# Patient Record
Sex: Male | Born: 1945 | Race: White | Hispanic: No | Marital: Married | Smoking: Former smoker
Health system: Southern US, Community
[De-identification: ages and names within clinical notes are randomized; demographics above are authoritative.]

## PROBLEM LIST (undated history)

## (undated) DIAGNOSIS — J449 Chronic obstructive pulmonary disease, unspecified: Secondary | ICD-10-CM

## (undated) DIAGNOSIS — K219 Gastro-esophageal reflux disease without esophagitis: Secondary | ICD-10-CM

## (undated) DIAGNOSIS — R001 Bradycardia, unspecified: Secondary | ICD-10-CM

## (undated) HISTORY — DX: Chronic obstructive pulmonary disease, unspecified: J44.9

## (undated) HISTORY — PX: HERNIA REPAIR: SHX51

## (undated) HISTORY — DX: Gastro-esophageal reflux disease without esophagitis: K21.9

## (undated) HISTORY — PX: AMPUTATION OF REPLICATED FINGERS: SHX1135

## (undated) HISTORY — DX: Bradycardia, unspecified: R00.1

---

## 1999-09-24 ENCOUNTER — Ambulatory Visit (HOSPITAL_BASED_OUTPATIENT_CLINIC_OR_DEPARTMENT_OTHER): Admission: RE | Admit: 1999-09-24 | Discharge: 1999-09-24 | Payer: Self-pay | Admitting: Orthopedic Surgery

## 1999-09-24 ENCOUNTER — Encounter (INDEPENDENT_AMBULATORY_CARE_PROVIDER_SITE_OTHER): Payer: Self-pay | Admitting: Specialist

## 2000-02-09 ENCOUNTER — Ambulatory Visit (HOSPITAL_COMMUNITY): Admission: RE | Admit: 2000-02-09 | Discharge: 2000-02-09 | Payer: Self-pay | Admitting: Cardiovascular Disease

## 2000-02-09 ENCOUNTER — Encounter: Payer: Self-pay | Admitting: Cardiovascular Disease

## 2004-10-17 ENCOUNTER — Encounter (INDEPENDENT_AMBULATORY_CARE_PROVIDER_SITE_OTHER): Payer: Self-pay | Admitting: *Deleted

## 2004-10-17 ENCOUNTER — Ambulatory Visit (HOSPITAL_COMMUNITY): Admission: RE | Admit: 2004-10-17 | Discharge: 2004-10-17 | Payer: Self-pay | Admitting: Gastroenterology

## 2006-03-09 ENCOUNTER — Encounter: Payer: Self-pay | Admitting: Pulmonary Disease

## 2006-03-09 ENCOUNTER — Encounter: Admission: RE | Admit: 2006-03-09 | Discharge: 2006-03-09 | Payer: Self-pay | Admitting: Family Medicine

## 2006-06-07 ENCOUNTER — Inpatient Hospital Stay (HOSPITAL_COMMUNITY): Admission: EM | Admit: 2006-06-07 | Discharge: 2006-06-09 | Payer: Self-pay | Admitting: Emergency Medicine

## 2006-06-07 ENCOUNTER — Ambulatory Visit: Payer: Self-pay | Admitting: Internal Medicine

## 2006-06-07 ENCOUNTER — Ambulatory Visit: Payer: Self-pay | Admitting: Cardiology

## 2006-08-09 ENCOUNTER — Encounter: Payer: Self-pay | Admitting: Pulmonary Disease

## 2006-08-09 ENCOUNTER — Encounter: Admission: RE | Admit: 2006-08-09 | Discharge: 2006-08-09 | Payer: Self-pay | Admitting: Family Medicine

## 2006-08-31 ENCOUNTER — Ambulatory Visit: Payer: Self-pay | Admitting: Pulmonary Disease

## 2007-12-21 ENCOUNTER — Emergency Department (HOSPITAL_COMMUNITY): Admission: EM | Admit: 2007-12-21 | Discharge: 2007-12-22 | Payer: Self-pay | Admitting: Emergency Medicine

## 2007-12-23 ENCOUNTER — Ambulatory Visit: Payer: Self-pay | Admitting: Cardiovascular Disease

## 2007-12-23 ENCOUNTER — Ambulatory Visit: Payer: Self-pay | Admitting: Pulmonary Disease

## 2007-12-23 DIAGNOSIS — J449 Chronic obstructive pulmonary disease, unspecified: Secondary | ICD-10-CM

## 2007-12-23 DIAGNOSIS — J441 Chronic obstructive pulmonary disease with (acute) exacerbation: Secondary | ICD-10-CM

## 2007-12-23 DIAGNOSIS — R042 Hemoptysis: Secondary | ICD-10-CM | POA: Insufficient documentation

## 2007-12-23 LAB — CONVERTED CEMR LAB
BUN: 17 mg/dL (ref 6–23)
Chloride: 106 meq/L (ref 96–112)
Glucose, Bld: 111 mg/dL — ABNORMAL HIGH (ref 70–99)
Potassium: 4.6 meq/L (ref 3.5–5.1)
Sodium: 135 meq/L (ref 135–145)

## 2008-03-22 ENCOUNTER — Ambulatory Visit: Payer: Self-pay | Admitting: Pulmonary Disease

## 2008-09-21 ENCOUNTER — Ambulatory Visit: Payer: Self-pay | Admitting: Pulmonary Disease

## 2008-10-01 ENCOUNTER — Ambulatory Visit: Payer: Self-pay | Admitting: Cardiology

## 2008-10-24 ENCOUNTER — Encounter: Admission: RE | Admit: 2008-10-24 | Discharge: 2008-10-24 | Payer: Self-pay | Admitting: Family Medicine

## 2008-10-29 ENCOUNTER — Telehealth (INDEPENDENT_AMBULATORY_CARE_PROVIDER_SITE_OTHER): Payer: Self-pay | Admitting: *Deleted

## 2008-10-30 ENCOUNTER — Ambulatory Visit: Payer: Self-pay | Admitting: Pulmonary Disease

## 2008-12-04 DIAGNOSIS — K219 Gastro-esophageal reflux disease without esophagitis: Secondary | ICD-10-CM

## 2008-12-04 DIAGNOSIS — R42 Dizziness and giddiness: Secondary | ICD-10-CM | POA: Insufficient documentation

## 2008-12-04 DIAGNOSIS — I498 Other specified cardiac arrhythmias: Secondary | ICD-10-CM | POA: Insufficient documentation

## 2009-01-09 ENCOUNTER — Encounter (INDEPENDENT_AMBULATORY_CARE_PROVIDER_SITE_OTHER): Payer: Self-pay | Admitting: *Deleted

## 2009-03-20 ENCOUNTER — Ambulatory Visit: Payer: Self-pay | Admitting: Pulmonary Disease

## 2009-04-25 ENCOUNTER — Telehealth: Payer: Self-pay | Admitting: Pulmonary Disease

## 2009-09-16 ENCOUNTER — Ambulatory Visit: Payer: Self-pay | Admitting: Pulmonary Disease

## 2009-10-21 ENCOUNTER — Telehealth (INDEPENDENT_AMBULATORY_CARE_PROVIDER_SITE_OTHER): Payer: Self-pay | Admitting: *Deleted

## 2010-03-17 ENCOUNTER — Ambulatory Visit: Payer: Self-pay | Admitting: Pulmonary Disease

## 2010-09-04 NOTE — Assessment & Plan Note (Signed)
Summary: rov for emphysema.   Primary Provider/Referring Provider:  [facility]  CC:  Pt is here for a 6 month f/u appt.  Pt states breathing is better since being on Symbicort.  Pt states he will occ cough up white sputum. Pt states he hasn't needed to use neb meds since he was last seen by Dr. Shelle Iron.  Marland Kitchen  History of Present Illness: the pt comes in today for f/u of his known emphysema.  He feels that he has been doing much better since being on symbicort, and has had no recent acute exacerbation.  He feels his exertional tolerance is at his usual baseline.  He denies any chest congestion, cough, or purulence.  Current Medications (verified): 1)  Albuterol Sulfate (2.5 Mg/29ml) 0.083%  Nebu (Albuterol Sulfate) .... As Directed 2)  Multivitamins   Tabs (Multiple Vitamin) .Marland Kitchen.. 1 By Mouth Daily 3)  Vitamin B-12 1000 Mcg  Tabs (Cyanocobalamin) .Marland Kitchen.. 1 By Mouth Daily 4)  Symbicort 160-4.5 Mcg/act Aero (Budesonide-Formoterol Fumarate) .... Inhale 2 Puffs Two Times A Day. Rinse, Gargle, Spit Well After Use. 5)  Proair Hfa 108 (90 Base) Mcg/act Aers (Albuterol Sulfate) .... 2 Puffs Every 4 Hours As Needed For Emergency 6)  Mucus Relief 400 Mg Tabs (Guaifenesin) 7)  Fish Oil 1000 Mg Caps (Omega-3 Fatty Acids) .... Take 2 Tabs By Mouth Daily  Allergies (verified): No Known Drug Allergies  Review of Systems      See HPI  Vital Signs:  Patient profile:   65 year old male Height:      73 inches Weight:      211.38 pounds BMI:     27.99 O2 Sat:      98 % on Room air Temp:     97.2 degrees F oral Pulse rate:   75 / minute BP sitting:   130 / 88  (left arm) Cuff size:   large  Vitals Entered By: Arman Filter LPN (September 16, 2009 8:53 AM)  O2 Flow:  Room air CC: Pt is here for a 6 month f/u appt.  Pt states breathing is better since being on Symbicort.  Pt states he will occ cough up white sputum. Pt states he hasn't needed to use neb meds since he was last seen by Dr. Shelle Iron.     Comments Medications reviewed with patient Arman Filter LPN  September 16, 2009 8:53 AM    Physical Exam  General:  wd male in nad Lungs:  mild decrease in bs, o/w clear Heart:  rrr Extremities:  no edema or cyanosis   Impression & Recommendations:  Problem # 1:  EMPHYSEMA (ICD-492.8)  the pt is doing very well from a pulmonary standpoint.  He feels he is doing better with symbicort, but he can get advair cheaper on his insurance plan.  I do not mind making the change to see how he does.  I have asked him to stay as active as possible, and to f/u with me in 6mos.  Medications Added to Medication List This Visit: 1)  Mucus Relief 400 Mg Tabs (Guaifenesin) 2)  Fish Oil 1000 Mg Caps (Omega-3 fatty acids) .... Take 2 tabs by mouth daily  Other Orders: Est. Patient Level II (04540)  Patient Instructions: 1)  will change symbicort to advair HFA 115/21  2 inhalations am and pm everyday.  Rinse mouth well.  Let me know if this is working as well for you as symbicort, and we can get you a prescription. 2)  followup with me in 6mos.   Immunization History:  Influenza Immunization History:    Influenza:  historical (08/04/2007)  Pneumovax Immunization History:    Pneumovax:  historical (08/03/2005)

## 2010-09-04 NOTE — Progress Notes (Signed)
Summary: prescript   LMTCBX1  Phone Note Call from Patient   Caller: Patient Call For: clance Summary of Call: calling for symbicort  [facility] Initial call taken by: Rickard Patience,  October 21, 2009 1:37 PM  Follow-up for Phone Call        per last ov with [facility] on 09-16-2009, pt was changed from Symbicort to Advair 115/21 2 puffs two times a day.  [id number].  Aundra Millet Reynolds LPN  October 21, 2009 1:40 PM   Spoke with pt's spouse.  She states that pt is not doing as well with the advair HFA. He would like to switch back to the symbicort and is requesting a 3 month supply.  Will forward to doc of the day.  Please advise thanksd   Follow-up by: Vernie Murders,  October 21, 2009 3:48 PM  Additional Follow-up for Phone Call Additional follow up Details #1::        please call in script for symbicort 160/4.5 two puffs two times a day.  Advised pt to rinse mouth after each use.  Please dispense 3 with 3 refills. Additional Follow-up by: Coralyn Helling MD,  October 21, 2009 4:39 PM     Appended Document: prescript   LMTCBX1 Rx was sent and spoke with pt's spouse and made aware this was done.

## 2010-09-04 NOTE — Assessment & Plan Note (Signed)
Summary: rov for emphysema   Primary Provider/Referring Provider:  [facility]  CC:  Pt is here for a 6 month f/u appt.  Pt states breathing is unchanged from last visit.   Pt c/o coughing up small amounts of white sputum.  Pt does c/o "scratchy throat" but believes this is d/t PND.  Marland Kitchen  History of Present Illness: the pt comes in today for f/u of his known emphysema.  He felt that symbicort worked better for him than advair, and changed back.  He currently feels that he is doing well, and is staying active.  No chest congestion, or change in his exertional tolerance.  He does note a scratchy throat with mild cough that he thinks is due to PND, but he is also on an ACE inhibitor.  Current Medications (verified): 1)  Albuterol Sulfate (2.5 Mg/32ml) 0.083%  Nebu (Albuterol Sulfate) .... As Directed 2)  Multivitamins   Tabs (Multiple Vitamin) .Marland Kitchen.. 1 By Mouth Daily 3)  Vitamin B-12 1000 Mcg  Tabs (Cyanocobalamin) .Marland Kitchen.. 1 By Mouth Daily 4)  Proair Hfa 108 (90 Base) Mcg/act Aers (Albuterol Sulfate) .... 2 Puffs Every 4 Hours As Needed For Emergency 5)  Mucus Relief 400 Mg Tabs (Guaifenesin) 6)  Fish Oil 1000 Mg Caps (Omega-3 Fatty Acids) .... Take 2 Tabs By Mouth Daily 7)  Symbicort 160-4.5 Mcg/act Aero (Budesonide-Formoterol Fumarate) .... 2 Puffs Two Times A Day Rinse Mouth Well 8)  Lisinopril 20 Mg Tabs (Lisinopril) .... Take 1 Tablet By Mouth Once A Day  Allergies (verified): No Known Drug Allergies  Review of Systems       The patient complains of shortness of breath with activity, productive cough, sore throat, headaches, and nasal congestion/difficulty breathing through nose.  The patient denies shortness of breath at rest, non-productive cough, coughing up blood, chest pain, irregular heartbeats, acid heartburn, indigestion, loss of appetite, weight change, abdominal pain, difficulty swallowing, tooth/dental problems, sneezing, itching, ear ache, anxiety, depression, hand/feet  swelling, joint stiffness or pain, rash, change in color of mucus, and fever.    Vital Signs:  Patient profile:   65 year old male Height:      73 inches Weight:      209.38 pounds BMI:     27.72 O2 Sat:      95 % on Room air Temp:     97.5 degrees F oral Pulse rate:   52 / minute BP sitting:   130 / 78  (left arm) Cuff size:   regular  Vitals Entered By: Arman Filter LPN (March 17, 2010 9:04 AM)  O2 Flow:  Room air CC: Pt is here for a 6 month f/u appt.  Pt states breathing is unchanged from last visit.   Pt c/o coughing up small amounts of white sputum.  Pt does c/o "scratchy throat" but believes this is d/t PND.   Comments Medications reviewed with patient Arman Filter LPN  March 17, 2010 9:04 AM    Physical Exam  General:  wd male in nad Lungs:  good air movement, no wheezing, a few rhonchi Heart:  rrr, no mrg Extremities:  no edema or cyanosis Neurologic:  alert and oriented, moves all 4.   Impression & Recommendations:  Problem # 1:  EMPHYSEMA (ICD-492.8) the pt has known emphysema with symptoms that are fairly well controlled with  current meds.  I have asked him to stay active, and to call if having worsening symptoms.  He does have a mild cough with "scratchy  throat", but is on an ACE.  I have asked him to discuss this with his primary md.  Medications Added to Medication List This Visit: 1)  Lisinopril 20 Mg Tabs (Lisinopril) .... Take 1 tablet by mouth once a day  Other Orders: Est. Patient Level III (16109)  Patient Instructions: 1)  no change in meds. 2)  if your "scratchy  throat" continues or worsens, please speak with your primary md about your blood pressure medications. 3)  followup with me in 6mos.

## 2010-09-15 ENCOUNTER — Encounter: Payer: Self-pay | Admitting: Pulmonary Disease

## 2010-09-15 ENCOUNTER — Ambulatory Visit (INDEPENDENT_AMBULATORY_CARE_PROVIDER_SITE_OTHER): Payer: Medicare PPO | Admitting: Pulmonary Disease

## 2010-09-15 DIAGNOSIS — J438 Other emphysema: Secondary | ICD-10-CM

## 2010-09-24 NOTE — Assessment & Plan Note (Signed)
Summary: Robert Turner for emphysema   Primary Provider/Referring Provider:  [facility]  CC:  6 month f/u appt for Emphysema.  Pt c/o coughing up white sputum and increased sob with exertion.  Denies smoking.  Pt does c/o occ chest pain and wonders if this is d/t "gas." .  History of Present Illness: the pt comes in today for f/u of his known emphysema.  He is maintaining a stable baseline wrt exertional tolerance, and has not had a recent pulmonary infection.  He does note some "congestion" in the am's, but no purulence noted.  I wonder from his description if this is PND.  Current Medications (verified): 1)  Albuterol Sulfate (2.5 Mg/34ml) 0.083%  Nebu (Albuterol Sulfate) .... As Directed As Needed 2)  Vitamin B-12 1000 Mcg  Tabs (Cyanocobalamin) .Marland Kitchen.. 1 By Mouth Daily 3)  Proair Hfa 108 (90 Base) Mcg/act Aers (Albuterol Sulfate) .... 2 Puffs Every 4 Hours As Needed For Emergency 4)  Mucus Relief 400 Mg Tabs (Guaifenesin) .... As Needed 5)  Fish Oil 1000 Mg Caps (Omega-3 Fatty Acids) .... Take 2 Tabs By Mouth Daily 6)  Symbicort 160-4.5 Mcg/act Aero (Budesonide-Formoterol Fumarate) .... 2 Puffs Two Times A Day Rinse Mouth Well  Allergies (verified): No Known Drug Allergies  Review of Systems       The patient complains of shortness of breath with activity, productive cough, chest pain, irregular heartbeats, acid heartburn, nasal congestion/difficulty breathing through nose, sneezing, and joint stiffness or pain.  The patient denies shortness of breath at rest, non-productive cough, coughing up blood, indigestion, loss of appetite, weight change, abdominal pain, difficulty swallowing, sore throat, tooth/dental problems, headaches, itching, ear ache, anxiety, depression, hand/feet swelling, rash, change in color of mucus, and fever.    Vital Signs:  Patient profile:   65 year old male Height:      73 inches Weight:      211.50 pounds BMI:     28.00 O2 Sat:      95 % on Room air Temp:      97.6 degrees F oral Pulse rate:   64 / minute BP sitting:   122 / 70  (left arm) Cuff size:   large  Vitals Entered By: Arman Filter LPN (September 15, 2010 9:05 AM)  O2 Flow:  Room air CC: 6 month f/u appt for Emphysema.  Pt c/o coughing up white sputum and increased sob with exertion.  Denies smoking.  Pt does c/o occ chest pain and wonders if this is d/t "gas."  Comments Medications reviewed with patient Arman Filter LPN  September 15, 2010 9:05 AM    Physical Exam  General:  wd male in nad Lungs:  totally clear to auscultation no wheezing or rhonchi  Heart:  rrr, no mrg Extremities:  no edema or cyanosis noted. Neurologic:  alert and oriented, moves all 4.    Impression & Recommendations:  Problem # 1:  EMPHYSEMA (ICD-492.8)  the pt is doing fairly well overall.  He has had no acute exacerbation or recent pulmonary infection.  I have asked him to stay on his current meds, and to continue to stay as active as possible.  I wonder if his early am "congestion" is actually postnasal drip, and asked him to try an antihistamine at bedtime.  He will f/u with me in 6mos.  Medications Added to Medication List This Visit: 1)  Albuterol Sulfate (2.5 Mg/9ml) 0.083% Nebu (Albuterol sulfate) .... As directed as needed 2)  Mucus Relief 400  Mg Tabs (Guaifenesin) .... As needed  Other Orders: Est. Patient Level III (16109)  Patient Instructions: 1)  stay on symbicort everyday 2)  use albuterol for rescue if needed 3)  stay as active as possible 4)  try chlorpheniramine 4mg  or 8mg  at bedtime to see if cough and mucus gets better in am when you arise  5)  followup with me in 6mos.

## 2010-12-16 NOTE — Assessment & Plan Note (Signed)
[facility]                            CARDIOLOGY OFFICE NOTE   SAID, RUEB                      MRN:          [id number]  DATE:10/01/2008                            DOB:          Sep 07, 1945     Robert Turner is a 65 year old male who I am asked to evaluate for  dizziness.  The patient has had previous cardiac catheterization in  November 2007 secondary to chest pain.  At that time, he was found to  have ejection fraction 55% and normal coronary arteries.  He typically  does not have significant orthopnea or PND or no pedal edema.  He can  have mild dyspnea on exertion felt secondary to COPD.  He does not have  exertional chest pain.  The patient states that since yesterday he has  had a dizziness.  This is predominantly with standing, but he can also  have it with sitting still.  There is no associated dysarthria,  weakness, or loss of sensation in extremities or incontinence.  He has  had no syncope.  He was seen at Dr. Chauncy Passy office today.  He had an  electrocardiogram which showed marked sinus bradycardia at a rate of 42.  The axis was normal.  There were no ST changes noted.  Because of his  dizziness and bradycardia, we were asked to further evaluate.   MEDICATIONS:  His medications include:  Combivent, prednisone, fish oil,  and vitamin B12.  He takes Mucin ex as needed.   He has no known drug allergies.   SOCIAL HISTORY:  He has remote history of tobacco use, but has not  smoked since September 2007.  He does not consume alcohol.  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   FAMILY HISTORY:  Positive coronary disease in his mother and father.   PAST MEDICAL HISTORY:  Significant for COPD.  He also has history of  peptic ulcer disease and gastroesophageal reflux disease and he also has  a history benign prostatic hypertrophy.  He also has arthritis.  He has  had previous hernia repair and polyp removed.   REVIEW OF SYSTEMS:  He  denies any headaches or fevers or chills.  He has  had stopped up left ear.  He has also had recent bronchitis.  There is  no hemoptysis.  There is no dysphagia, odynophagia, or hematochezia.  There is no dysuria or hematuria.  No seizure activity.  There is no  orthopnea, PND, or pedal edema.  There is no claudication noted.  Remaining systems are negative.   PHYSICAL EXAMINATION:  VITAL SIGNS:  His physical examination today  shows a blood pressure in the lying position of 131/76 and pulse of 55.  In the standing position, his heart rate is 67 with a blood pressure of  122/75.  He did state he felt funny standing.  GENERAL:  He is well developed, well nourished, in no acute distress at  present.  SKIN:  Warm and dry.  He does not appear to be depressed.  There is no  peripheral clubbing.  BACK:  Normal.  HEENT:  normal with normal eyelids.  NECK:  Supple with a normal upstroke bilaterally.  No bruits noted.  There is no jugular venous distention.  I cannot appreciate thyromegaly.  CHEST:  Clear to auscultation.  No expansion.  CARDIOVASCULAR:  Bradycardic rate but a regular rhythm.  There are no  murmurs, rubs, or gallops.  ABDOMEN:  Nontender, nondistended.  Positive bowel sounds.  No  hepatosplenomegaly.  No mass appreciated.  There is no abdominal bruit.  EXTREMITIES:  He has 2+ femoral pulses bilaterally.  No Bruits.  Extremities show no edema palpated.  No cords.  He has 2+ posterior  tibial pulses bilaterally.  NEUROLOGIC:  Grossly intact.   His electrocardiogram is described above.   DIAGNOSES:  1. Dizziness - The etiology of this is unclear.  However, he did have      some dizziness in the office with standing and his blood pressure      is 122/75 with a heart rate at 57.  I am not convinced that his      bradycardia is related to his dizziness.  I wonder whether he may      be having vertigo as he has had problems with his left inner ear.      We will give him Antivert  25 mg p.o. b.i.d. to see if this improves      his symptoms.  Note, I do not find any focal neurological findings      on examination.  2. Bradycardia - The patient's heart rate was 42 in the office of Dr.      Duanne Guess, we will schedule him to have a 24-hour Holter monitor to      fully evaluate.  I also scheduled him to have an exercise treadmill      to evaluate for chronotropic competence.  3. Chronic obstructive pulmonary disease.  4. History of gastroesophageal reflux disease.  5. History of benign prostatic hypertrophy.   We will see him back in approximately 8 weeks after we have his  treadmill and his monitor.  Hopefully the Antivert will also improve his  symptoms.     Madolyn Frieze Jens Som, MD, [facility]  Electronically Signed    BSC/MedQ  DD: 10/01/2008  DT: 10/02/2008  Job #: [id number]   cc:   Maryelizabeth Rowan, M.D.

## 2010-12-19 NOTE — Discharge Summary (Signed)
Robert Turner, Robert Turner               ACCOUNT NO.:  [id number]   MEDICAL RECORD NO.:  [id number]          PATIENT TYPE:  INP   LOCATION:  3729                         FACILITY:  [facility]   PHYSICIAN:  Robert Turner, M.D.DATE OF BIRTH:  05/06/1946   DATE OF ADMISSION:  06/07/2006  DATE OF DISCHARGE:  06/09/2006                                 DISCHARGE SUMMARY   DISCHARGE DIAGNOSES:  1. Chest pain, etiology is undetermined, but likely noncardiac.  2. COPD.  3. History of tobacco abuse, quit seven weeks ago.   DISCHARGE MEDICATIONS:  1. Ranitidine 150 mg p.o. b.i.d.  2. Aspirin 81 mg p.o. daily.   CONSULTS:  Robert Turner of [facility] was consulted on  June 08, 2006.   PROCEDURES:  A cardiac catheterization was performed on June 08, 2006  that showed normal coronary arteries without evidence of disease.   HOSPITAL COURSE:  Robert Turner is a 64 year old white male with a history of  exertional chest tightness and shortness of breath.  The chest tightness is  substernal, crushing in quality, and is relieved with rest.  He first  noticed the symptoms approximately three to four weeks ago.  He has no known  coronary artery disease.  Given his history of smoking and his age, he was  admitted in order to rule out myocardial infarction.  An initial EKG on  admission showed sinus bradycardia with no acute ST changes or T wave  inversions.  His cardiac enzymes were negative x3.  A repeat EKG the next  morning after admission was unchanged from his previous EKG.  In addition to  ruling out cardiac causes of his chest pain, he was also started on Protonix  40 mg p.o. daily to rule out GI causes of chest pain.  A cardiology consult  was obtained on June 08, 2006 by Robert Turner.  The patient was  then taken for cardiac catheterization the evening of November 6.  His  cardiac cath was negative and the cardiologist suggested working up some  noncardiac etiology of  his chest pain.  A D-dimer was obtained and was less  than .22, which essentially rules out the possibility of a pulmonary  embolism.  Of note, he is at low risk for pulmonary embolism.  Throughout  admission the patient was free of chest pain.  He will be discharged to have  further workup of this chest pain by his primary doctor.  He will be  discharged on ranitidine 150 mg p.o. b.i.d. since his chest pain was  improved in the hospital while on Protonix.  Robert Turner feels that his  chest pain may have been caused by initiating therapy with Spiriva for his  COPD.  He has been instructed to follow up with his primary doctor about  switching this inhaler medication.  In addition to gastroesophageal reflux  disease, the patient's primary doctor may also consider Prinzmetal's angina  and esophageal spasm as possible causes of his chest pain.   HOSPITAL FOLLOWUP:  Robert Turner will follow up with his primary doctor, Dr.  Kathrynn Turner, at [facility]  on [address].  He will call to make a followup  appointment.   IMPORTANT LABORATORY DATA:  The patient's lipid profile was as follows:  LDL  82, HDL 31, and triglycerides 540.  A CBC and basic metabolic panel were  both unremarkable.  As mentioned above, his cardiac enzymes were negative x2  and a D-dimer was negative for PE.     ______________________________  Robert Turner, M.D.    ______________________________  Robert Turner, M.D.    MJ/MEDQ  D:  06/09/2006  T:  06/09/2006  Job:  [id number]   cc:   Robert Turner. Robert Som, MD, [facility]

## 2010-12-19 NOTE — Consult Note (Signed)
Robert Turner, Robert Turner               ACCOUNT NO.:  [id number]   MEDICAL RECORD NO.:  [id number]          PATIENT TYPE:  INP   LOCATION:  3729                         FACILITY:  [facility]   PHYSICIAN:  Madolyn Frieze. Jens Som, MD, FACCDATE OF BIRTH:  11/06/1945   DATE OF CONSULTATION:  06/08/2006  DATE OF DISCHARGE:                                   CONSULTATION   PRIMARY CARE PHYSICIAN:  Dr. Kathrynn Running at [facility].   PRIMARY CARDIOLOGIST:  New and will be Dr. Jens Som.   CHIEF COMPLAINT:  Chest pain/shortness of breath.   HISTORY OF PRESENT ILLNESS:  Robert Turner is a 65 year old male with no  previous documented history of coronary artery disease.  He had some chest  pain in 2001 and had an exercise Cardiolite at that time that showed no scar  or ischemia with an EF 51%.  The patient states that he saw Dr. Corinda Gubler at  that time but the stress test in the computer is documented as being ordered  by Dr. Elease Hashimoto.  He has not had cardiology follow-up since that time.  He  also had a history of melena in 2006 and had an EGD and colonoscopy at that  time.  He was diagnosed with erosive duodenitis and gastritis.   Robert Turner reports approximately a three-week history of increased dyspnea  on exertion that is associated with chest tightness.  The symptoms resolve  with rest in approximately 15-30 minutes.  They are associated with a  choking feeling but he denies nausea, vomiting or diaphoresis.  He also had  one episode of pain that started at rest and resolved in approximately the  same amount of time.   Yesterday, [name] had increasing shortness of breath.  He went to [facility] and was sent to the emergency room where he was admitted for further  evaluation.  Because of his multiple cardiac risk factors, cardiology was  asked to evaluate him.  He is currently symptom free.   Robert Turner has no history of diabetes, hypertension or hyperlipidemia,  although he has not had a recent physical.   He does have family history of  coronary artery disease and tobacco use.  He has a history of COPD.  He had  a history of melena in 2006 for which he had an EGD and colonoscopy with  biopsies.  He was diagnosed with erosive duodenitis and gastritis.  He had  an exercise Cardiolite in 2001 that showed no scar or ischemia with an EF  51%.  He denies any other significant medical history.   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  He has had two or three colonoscopies as  well as an EGD and hernia repair.   ALLERGIES:  No known drug allergies.   MEDICATIONS:  Prior to admission were Spiriva.  In the hospital, he is on  Spiriva plus aspirin 325 mg daily, Lovenox 85 mg q.12h., and Protonix 40 mg  a day.   SOCIAL HISTORY:  He lives in [place] with his  wife and works as a Designer, fashion/clothing.  He has a greater than 100-pack-year  history of tobacco use and quit 7 weeks ago.  He has a remote history of  EtOH use but has not used alcohol in some time.  He denies drug abuse.   FAMILY HISTORY:  Both of his parents were in their 23s when they died and  both had coronary artery disease; his father at a younger age than his  mother, although the patient is not sure exactly when.  He has no siblings  with coronary artery disease.   REVIEW OF SYSTEMS:  Significant for occasional melena, last episode 2-3  months ago.  He has arthralgias when he first gets up in the morning or gets  out of the truck after riding a long time.  He has significant lower  extremity pain both in his hips, knees and ankles.  The chest pain as  described above.  He has some dyspnea on exertion but denies orthopnea, PND,  edema or palpitations.  He has had no presyncope or syncope.  His symptoms  are not clearly claudication in origin as they resolve with exercise and do  not get worse.  He has a history of cough that is  productive of a small  amount of sputum and he says he has seen a little bit of blood in the  sputum.  He has noted himself to wheeze in the past.  Review of systems is  otherwise negative.   PHYSICAL EXAM:  VITAL SIGNS:  Temperature is 98.1, blood pressure 113/62,  heart rate 54, respiratory rate 20, O2 saturation 96% on room air.  GENERAL:  He is a well-developed, well-nourished white male in no acute  distress.  HEENT:  His head is normocephalic and atraumatic with pupils equal, round,  react to light accommodation.  Extraocular movements intact.  Sclerae clear.  Nares without discharge.  NECK:  There is no JVD, no thyromegaly, no carotid bruits are appreciated.  CHEST:  He has rales in the bases with a slight expiratory wheeze but no  crackles are noted.  CVA:  His heart is regular in rate and rhythm with a S1-S2 and no  significant murmur, rub or gallop is noted.  ABDOMEN:  Soft and nontender with active bowel sounds.  EXTREMITIES:  There is no cyanosis, clubbing or edema noted.  He is missing  his left fifth finger.  Distal pulses are intact in all four extremities.  MUSCULOSKELETAL:  There is no joint deformity or effusions and no spine or  CVA tenderness.  NEURO:  He is alert and oriented with cranial nerves II through XII intact  grossly intact.   Chest x-ray shows COPD and emphysema with left upper lobe granuloma and left  lower lobe scar.  No change and no acute disease.   EKG:  Sinus bradycardia rate 50 with no acute ischemic changes and no old  EKG is available for comparison.   LABORATORY VALUES:  Hemoglobin 13.5, hematocrit 38.7, WBCs 8.8, platelets  169.  Sodium 140, potassium 4.0, chloride 107, BUN 13, creatinine 1.0,  glucose 77, CK-MB and troponin I negative x2.  Total cholesterol 145,  triglycerides 162, HDL 31, LDL 82.   IMPRESSION:  Chest pain:  His enzymes are negative for myocardial infarction but over the past four weeks he has had progressive dyspnea on  exertion that  was associated with chest tightness.  He also describes some radiation to  his neck.  All of his symptoms are relieved by rest.  His symptoms are  concerning for anginal pain.  Therefore, we will proceed with  catheterization for definitive evaluation.  The risks and benefits of the  catheterization were  discussed with the patient and his wife and they agree to proceed.  We will  continue the aspirin, Lovenox and add a statin.  Some of the symptoms may be  due chronic obstructive pulmonary disease but definitive evaluation is  warranted.      Theodore Demark, PA-C    ______________________________  Madolyn Frieze. Jens Som, MD, [facility]    RB/MEDQ  D:  06/08/2006  T:  06/08/2006  Job:  [id number]

## 2010-12-19 NOTE — Op Note (Signed)
NAME:  RAYMOUND, KATICH               ACCOUNT NO.:  [id number]   MEDICAL RECORD NO.:  [id number]          PATIENT TYPE:  AMB   LOCATION:  ENDO                         FACILITY:  [facility]   PHYSICIAN:  Graylin Shiver, M.D.   DATE OF BIRTH:  1946-01-26   DATE OF PROCEDURE:  10/17/2004  DATE OF DISCHARGE:                                 OPERATIVE REPORT   INDICATIONS FOR PROCEDURE:  Intermittent heartburn, intermittent melena,  rule out upper GI lesion.   Informed consent was obtained after explanation of the risks of bleeding,  infection and perforation.   PREMEDICATION:  Fentanyl 75 mcg IV, Versed 7.5 milligrams IV.   PROCEDURE:  With the patient in the left lateral decubitus position, the  Olympus gastroscope was inserted into the oropharynx and passed into the  esophagus. It was advanced down the esophagus and into the stomach and into  the duodenum. The second portion of the duodenum looked normal. The bulb of  the duodenum revealed deformity and erosive duodenitis. The stomach showed  erosive gastritis. Biopsies were obtained for histology and to look for  evidence of Helicobacter pylori. No lesions were seen in the fundus or  cardia on retroflexion. The esophagus looked normal. He tolerated the  procedure well without complications.   IMPRESSION:  1.  Erosive duodenitis.  2.  Erosive gastritis.   PLAN:  The biopsies will be checked to look for evidence of Helicobacter  pylori. If this is positive it will be treated. I am going to give the  patient a prescription for Aciphex 20 milligrams daily.      SFG/MEDQ  D:  10/17/2004  T:  10/17/2004  Job:  [id number]   cc:   Lindaann Pascal, P.A.-C  [place] [facility]

## 2010-12-19 NOTE — Cardiovascular Report (Signed)
Robert Turner, Robert Turner               ACCOUNT NO.:  [id number]   MEDICAL RECORD NO.:  [id number]          PATIENT TYPE:  INP   LOCATION:  3729                         FACILITY:  [facility]   PHYSICIAN:  Salvadore Farber, MD  DATE OF BIRTH:  28-Feb-1946   DATE OF PROCEDURE:  06/08/2006  DATE OF DISCHARGE:                              CARDIAC CATHETERIZATION   PROCEDURE:  Left heart catheterization, left ventriculography, coronary  angiography.   INDICATIONS:  Mr. Marcy is a 65 year old gentleman without prior history  of atherosclerotic coronary disease.  Risk factors are remarkable for a  longstanding tobacco abuse.  He presents with 3 weeks of the reproducible  exertional dyspnea and chest pain.  He ruled out for myocardial infarction  and was referred for diagnostic angiography due to fairly high pretest  probability.   PROCEDURAL TECHNIQUE:  Informed consent was obtained.  Underwent 1%  lidocaine local anesthesia, a 5-French sheath was placed in the right common  femoral artery using the modified Seldinger technique.  Diagnostic  angiography and ventriculography were performed using JL4, JR4, and pigtail  catheters.  The patient tolerated the procedure well and was transferred to  holding room in stable condition.  Sheaths will be removed there.   COMPLICATIONS:  None.   FINDINGS:  1. LV:  106/0/8.  EF 55% without regional wall motion abnormality.  2. No aortic stenosis or mitral regurgitation.  3. Left main:  Angiographically normal.  4. LAD:  Moderate-sized vessel giving rise to a small first and large size      second diagonal branch.  There is a nonocclusive bridge in the LD just      distal to the second diagonal.  5. Ramus intermedius:  Moderate-sized vessel.  It is angiographically      normal.  6. Circumflex:  Moderate-sized vessel giving rise to a single marginal.      It is angiographically normal.  7. RCA:  Moderate-sized dominant vessel.  It is angiographically  normal.   IMPRESSION/PLAN:  The patient has angiographically normal coronary arteries  with normal left ventricular size and systolic function.  There is thus no  evidence of myocardial ischemia to explain his chest pain and dyspnea.      Salvadore Farber, MD  Electronically Signed     WED/MEDQ  D:  06/08/2006  T:  06/09/2006  Job:  [id number]

## 2010-12-19 NOTE — Assessment & Plan Note (Signed)
[facility]                             PULMONARY OFFICE NOTE   Robert Turner, Robert Turner                      MRN:          [id number]  DATE:08/31/2006                            DOB:          1946/07/11    HISTORY OF PRESENT ILLNESS:  The patient is a 65 year old gentleman who  I have been asked to see for severe emphysema.  The patient has a  history of longstanding emphysema and has been having great difficulty  with his breathing.  He most recently in the past two weeks has been on  a different inhaler regimen.  Seems to have done much, much better on  this.  The patient currently will get short of breath splitting wood or  carrying an arm load of wood in from the yard.  He does not have a lot  of difficulties with mild exertion and only some with moderate exertion.  Since being on the current regimen his cough has decreased  significantly, and he has rare mucous production.  He has had no  significant lower extremity edema.  He has had a CT last fall where he  was found to have old granulomatous disease and changes of COPD but no  other acute process.  He has a longstanding history of smoking, but has  not smoked since September 2007.   PAST MEDICAL HISTORY:  1. Emphysema as stated above.  2. History of allergic rhinitis.  3. History of chronic headaches.  4. History of hernia repair in the past.   CURRENT MEDICATIONS:  1. Asmanex 1 puff b.i.d.  2. Combivent inhaler 2 puffs q.i.d.  3. Claritin 10 mg daily.  4. Albuterol p.r.n.   ALLERGIES:  The patient has no known drug allergies.   SOCIAL HISTORY:  He is married and has children.  He works as a Ecologist.  He has a history of smoking up to three packs per day for 45  years.  He has not smoked since September 2007.   FAMILY HISTORY:  Remarkable for mother and father having emphysema and  heart disease.  His mother also had asthma.  He has a brother and sister  both with lung  cancer.   REVIEW OF SYSTEMS:  As per History of Present Illness.  Also see patient  intake form documented on the chart.   PHYSICAL EXAMINATION:  GENERAL:  He is a well-developed male in no acute  distress.  VITAL SIGNS:  Blood pressure 132/84, pulse 58, temperature 97.6, weight  196 pounds, O2 saturation on room air is 96%.  HEENT:  Pupils equal, round, reactive to light and accommodation.  Extraocular muscles are intact.  Nares are patent at discharge.  Oropharynx is clear.  NECK:  Supple without JVD or lymphadenopathy.  There is no palpable  thyromegaly.  CHEST:  Reveals decreased breath sounds throughout with no wheezing.  CARDIAC:  Reveals regular rate and rhythm with no murmurs, rubs or  gallops.  ABDOMEN:  Soft, nontender with good bowel sounds.  GENITAL EXAM/RECTAL/BREAST:  Not done and not indicated.  LOWER EXTREMITIES:  Without edema,  pulses intact distally.  NEUROLOGICAL:  Alert and oriented with no obvious deficits.   IMPRESSION:  History of significant chronic obstructive pulmonary  disease related to his past smoking history.  The patient has been tried  on various medical regimens including Advair and Spiriva and feels that  he did not tolerate those.  Given the fact that he is now on Asmanex and  Combivent and doing well, I would continue his current regimen.  I have  had a long conversation with him about the pathophysiology of emphysema  and how we go about treating it.  I have explained to him that no  medication will ever get his lung function back, but will take the lung  that he has left and enable it to work at peak efficiency.  He also  needs to work aggressively at exercise program, keeping his weight  minimized, and also keeping up with his flu vaccine yearly as well as  his pneumonia shot.   PLAN:  1. No change in current medications since the patient seems to be      doing well and is somewhat satisfied with his quality of life.  2. Work on continuing  an exercise program and keeping his weight down      as much as possible.  3. The patient will follow up on an as needed basis.  I would be more      than happy to see him if other issues arise.     Barbaraann Share, MD,FCCP  Electronically Signed    [facility]/MedQ  DD: 11/17/2006  DT: 11/17/2006  Job #: [id number]   cc:   Maryelizabeth Rowan, M.D.

## 2010-12-19 NOTE — Op Note (Signed)
NAMEELLIOT, Robert Turner               ACCOUNT NO.:  [id number]   MEDICAL RECORD NO.:  [id number]          PATIENT TYPE:  AMB   LOCATION:  ENDO                         FACILITY:  [facility]   PHYSICIAN:  Graylin Shiver, M.D.   DATE OF BIRTH:  21-Feb-1946   DATE OF PROCEDURE:  10/17/2004  DATE OF DISCHARGE:                                 OPERATIVE REPORT   PROCEDURE:  Colonoscopy with biopsy.   INDICATIONS FOR PROCEDURE:  History of colon polyps.   Informed consent was obtained after explanation of the risks of bleeding,  infection and perforation.   PREMEDICATION:  The procedure was done after an EGD with an additional 25  mcg of fentanyl and 2.5 mg of Versed given IV.   PROCEDURE:  With the patient in the left lateral decubitus position, a  rectal exam was performed.  No masses were felt.  The Olympus colonoscope  was inserted into the rectum and advanced around the colon to the cecum.  Cecal landmarks were identified.  The cecum and ascending colon were normal.  The transverse colon was normal.  The descending colon was norma.  In the  distal sigmoid and rectum there were a few tiny fleshy-colored polyps which  looked compatible with small hyperplastic polyps.  Representative biopsies  were obtained.  I do not think these were clinically significant.  He  tolerated the procedure well without complications.   IMPRESSION:  Few polyps in the rectosigmoid area compatible visibly with  hyperplastic polyps.  Representative biopsies were obtained and will be  checked.      SFG/MEDQ  D:  10/17/2004  T:  10/17/2004  Job:  [id number]   cc:   Lindaann Pascal, P.A.-C.  [facility]

## 2010-12-19 NOTE — Op Note (Signed)
[place]  Patient:    Robert Turner, Robert Turner                        MRN: [id number] Proc. Date: 09/24/99 Adm. Date:  [id number] Disc. 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  PROCEDURE:  Revision amputation at midphalangeal level.  SURGEON:  Artist Pais. Mina Marble, M.D.  ANESTHESIA:  Matt Holmes block.  TOURNIQUET TIME:  21 minutes.  COMPLICATIONS:  None.  DRAINS:  None.  CULTURES:  Cultures x 2 sent.  DESCRIPTION OF PROCEDURE:  The patient was taken to the operating room and after the induction of adequate Baer block anesthesia, the left upper extremity was prepped and draped in the usual sterile fashion.  At this point in time, the fifth digit was approached.  There was a significant amount of wet gangrenous chronic  infection particularly on the volar aspect of the fifth digit from the DIP section increased distally.  A large dorsally-based flap was marked out using skin marking pen and the distal tip of the fifth digit was then incised and the volar skin, he distal phalanx, and the nail bed, nail plate, and nail matrix were then excised, leaving a large dorsally based flap and the exposed distal aspect of the middle  phalanx.  The middle phalanx was then amputated at midlevel until healthy, noninfected bone was seen.  At this point in time, the large dorsally-based flap was then rotated over toward the volar side and after neurectomies were performed on both sides, the flap was closed with 4-0 nylon in simple sutures x 10.  This was preceding by copious amounts of irrigation with normal saline and debridement of all nonviable and infected tissue.  Cultures were sent intraoperatively for aerobicand anaerobic specimens and the wound was then  dressed with Xeroform, 4  4s, and a Coban wrap.  The patient tolerated the procedure well and went to the  recovery room in stable condition. DD:  09/24/99 TD:  09/24/99 Job: [id number]

## 2011-03-13 ENCOUNTER — Encounter: Payer: Self-pay | Admitting: Pulmonary Disease

## 2011-03-16 ENCOUNTER — Ambulatory Visit (INDEPENDENT_AMBULATORY_CARE_PROVIDER_SITE_OTHER): Payer: Medicare PPO | Admitting: Pulmonary Disease

## 2011-03-16 ENCOUNTER — Encounter: Payer: Self-pay | Admitting: Pulmonary Disease

## 2011-03-16 VITALS — BP 108/68 | HR 66 | Ht 73.0 in | Wt 204.8 lb

## 2011-03-16 DIAGNOSIS — J438 Other emphysema: Secondary | ICD-10-CM

## 2011-03-16 NOTE — Progress Notes (Signed)
  Subjective:    Patient ID: Robert Turner, male    DOB: Apr 22, 1946, 65 y.o.   MRN: [id number]  HPI The patient comes in today for followup of his known emphysema.  He is maintained on support with p.r.n. Albuterol.  He feels that he is doing well, and is staying very active in the garden and also working full time.  He had one episode of "bronchitis" since his last visit here, but resolved very quickly.   Review of Systems  Constitutional: Negative for fever and unexpected weight change.  HENT: Positive for ear pain, congestion, rhinorrhea, sneezing, postnasal drip and sinus pressure. Negative for nosebleeds, sore throat, trouble swallowing and dental problem.   Eyes: Positive for redness and itching.  Respiratory: Positive for wheezing. Negative for cough, chest tightness and shortness of breath.   Cardiovascular: Negative for palpitations and leg swelling.  Gastrointestinal: Negative for nausea and vomiting.  Genitourinary: Negative for dysuria.  Musculoskeletal: Negative for joint swelling.  Skin: Negative for rash.  Neurological: Positive for headaches.  Hematological: Bruises/bleeds easily.  Psychiatric/Behavioral: Negative for dysphoric mood. The patient is not nervous/anxious.        Objective:   Physical Exam Well-developed male in no acute distress Nares patent without discharge or purulence Chest with decreased breath sounds no wheezes or rhonchi Heart with regular rate and rhythm Lower extremities without edema, no cyanosis seen Alert and oriented, moves all 4 extremities.       Assessment & Plan:

## 2011-03-16 NOTE — Assessment & Plan Note (Signed)
The patient is doing well with respect to his emphysema on his current therapy.  He is staying very active in the garden, and also works everyday.  He had one episode of "bronchitis" since last visit, but has been doing well since.  I asked him to continue his current bronchodilator regimen, and to be sure that he gets a flu shot in the fall.  He is to followup with me in 6 months.

## 2011-03-16 NOTE — Patient Instructions (Signed)
No change in current meds. Continue to stay active followup with me in 6mos if doing well.

## 2011-09-16 ENCOUNTER — Ambulatory Visit (INDEPENDENT_AMBULATORY_CARE_PROVIDER_SITE_OTHER)
Admission: RE | Admit: 2011-09-16 | Discharge: 2011-09-16 | Disposition: A | Discharge status: Home / Self Care | Payer: Medicare PPO | Source: Ambulatory Visit | Attending: Pulmonary Disease | Admitting: Pulmonary Disease

## 2011-09-16 ENCOUNTER — Ambulatory Visit (INDEPENDENT_AMBULATORY_CARE_PROVIDER_SITE_OTHER): Payer: Medicare PPO | Admitting: Pulmonary Disease

## 2011-09-16 ENCOUNTER — Encounter: Payer: Self-pay | Admitting: Pulmonary Disease

## 2011-09-16 DIAGNOSIS — J438 Other emphysema: Secondary | ICD-10-CM

## 2011-09-16 MED ORDER — BUDESONIDE-FORMOTEROL FUMARATE 160-4.5 MCG/ACT IN AERO: 2.0000 | INHALATION_SPRAY | Freq: Two times a day (BID) | RESPIRATORY_TRACT | Status: DC

## 2011-09-16 NOTE — Progress Notes (Signed)
Addended by: Salli Quarry on: 09/16/2011 10:12 AM   Modules accepted: Orders

## 2011-09-16 NOTE — Patient Instructions (Signed)
Will check a cxr today, and call you with results. Will arrange for breathing studies in next few weeks, and let you know the results. If we cannot find a reason from a lung standpoint for your increased shortness of breath, would consider a heart evaluation by your primary doctor Stay on symbicort as you are doing.  Please schedule a followup visit with me in 6mos, but will call you results of your testing.

## 2011-09-16 NOTE — Progress Notes (Signed)
  Subjective:    Patient ID: Robert Turner, male    DOB: 14-May-1946, 66 y.o.   MRN: [id number]  HPI The patient comes in today for followup of his known COPD.  He has been compliant with his medication, but feels that his dyspnea and exertion has worsened since last visit.  He has been compliant with his medication, and has not had an acute exacerbation.  He denies any congestion, cough, or purulent mucus.  He has not had any worsening lower extremity edema.  He has had some atypical chest pain, but it is unclear from talking with him whether this is exertional or not.  He has not had a cardiac workup since 2007, and has not had a recent chest x-ray either.   Review of Systems  Constitutional: Negative for fever and unexpected weight change.  HENT: Negative for ear pain, nosebleeds, congestion, sore throat, rhinorrhea, sneezing, trouble swallowing, dental problem, postnasal drip and sinus pressure.   Eyes: Negative for redness and itching.  Respiratory: Positive for cough, chest tightness, shortness of breath and wheezing.   Cardiovascular: Negative for palpitations and leg swelling.  Gastrointestinal: Negative for nausea and vomiting.  Genitourinary: Negative for dysuria.  Musculoskeletal: Negative for joint swelling.  Skin: Negative for rash.  Neurological: Negative for headaches.  Hematological: Does not bruise/bleed easily.  Psychiatric/Behavioral: Negative for dysphoric mood. The patient is not nervous/anxious.        Objective:   Physical Exam Well-developed male in no acute distress Nose without purulence or discharge noted Oropharynx clear Chest with mildly decreased breath sounds, but no wheezes or rhonchi Cardiac exam regular rate and rhythm Lower extremities without edema, no cyanosis noted Alert and oriented, moves all 4 extremities.       Assessment & Plan:

## 2011-09-16 NOTE — Assessment & Plan Note (Signed)
The patient is noting worsening dyspnea on exertion, however there is no evidence for a pulmonary infection or an acute exacerbation.  It is unclear whether this is a pulmonary issue, cardiac issues, or related to deconditioning.  His oxygen saturations were adequate, and his lungs are currently clear.  He has not had a chest x-ray and PFTs in many many years, and therefore will schedule him for these.  If there is nothing on his chest x-ray of significance, and his breathing studies are not overly impressive, would consider a cardiac workup.  I have asked him to stay on his current bronchodilator regimen, and to work on conditioning.

## 2011-09-28 ENCOUNTER — Ambulatory Visit (INDEPENDENT_AMBULATORY_CARE_PROVIDER_SITE_OTHER): Payer: Medicare PPO | Admitting: Pulmonary Disease

## 2011-09-28 DIAGNOSIS — J438 Other emphysema: Secondary | ICD-10-CM

## 2011-09-28 LAB — PULMONARY FUNCTION TEST

## 2011-09-28 NOTE — Progress Notes (Signed)
PFT done today. 

## 2011-09-29 ENCOUNTER — Encounter: Payer: Self-pay | Admitting: Pulmonary Disease

## 2011-09-29 ENCOUNTER — Telehealth: Payer: Self-pay | Admitting: Pulmonary Disease

## 2011-09-29 NOTE — Telephone Encounter (Signed)
Please let pt know that his pfts show fairly severe emphysema, but he did have a good response with the inhaler medication during the test.  I would like to have him stay on his current regimen.   If his breathing worsens further, would suggest a cardiac evaluation for completeness to his primary md. Please find out who his primary is. Thanks.

## 2011-10-02 NOTE — Telephone Encounter (Signed)
LMOM for pt TCB.    Rewiewed pt's chart---- his pcp is Dr. Lynnell Jude at [facility] in [place], [place]

## 2011-10-06 NOTE — Telephone Encounter (Signed)
LMOM for pt TCB 

## 2011-10-07 NOTE — Telephone Encounter (Signed)
Called and spoke wit pt.  Informed him of PFT results and KC's recs.  Pt verbalized understanding and denied any questions.

## 2011-10-07 NOTE — Telephone Encounter (Signed)
PT RETURNED CALL. [phone]

## 2012-03-16 ENCOUNTER — Ambulatory Visit: Payer: Medicare PPO | Admitting: Pulmonary Disease

## 2012-04-07 ENCOUNTER — Encounter: Payer: Self-pay | Admitting: Pulmonary Disease

## 2012-04-07 ENCOUNTER — Ambulatory Visit (INDEPENDENT_AMBULATORY_CARE_PROVIDER_SITE_OTHER): Payer: Medicare PPO | Admitting: Pulmonary Disease

## 2012-04-07 VITALS — BP 128/70 | HR 63 | Temp 97.4°F | Ht 72.0 in | Wt 204.4 lb

## 2012-04-07 DIAGNOSIS — Z23 Encounter for immunization: Secondary | ICD-10-CM

## 2012-04-07 DIAGNOSIS — J438 Other emphysema: Secondary | ICD-10-CM

## 2012-04-07 NOTE — Patient Instructions (Addendum)
No change in medications. Stay active, and call if your breathing worsens. Will give you a flu shot today. followup with me in 6mos.

## 2012-04-07 NOTE — Assessment & Plan Note (Signed)
The patient is at baseline from a pulmonary standpoint on his current dilator regimen.  He has not had worsening symptoms or in acute exacerbation since last visit.  I have asked him to continue on symbicort, and have recommended a flu vaccine today as well.  He is to followup with me in 6 months.

## 2012-04-07 NOTE — Progress Notes (Signed)
  Subjective:    Patient ID: Robert Turner, male    DOB: 1946-05-25, 66 y.o.   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  He is staying compliant on symbicort, and rarely requires his rescue inhaler.  He has not had an acute exacerbation or chest cold since his last visit here.  He feels that his exertional tolerance is at baseline, and denies significant cough or mucus production.   Review of Systems  Constitutional: Negative for fever and unexpected weight change.  HENT: Positive for congestion, rhinorrhea, sneezing and sinus pressure. Negative for ear pain, nosebleeds, sore throat, trouble swallowing, dental problem and postnasal drip.   Eyes: Negative for redness and itching.  Respiratory: Positive for cough, chest tightness, shortness of breath and wheezing.   Cardiovascular: Negative for palpitations and leg swelling.  Gastrointestinal: Negative for nausea and vomiting.  Genitourinary: Negative for dysuria.  Musculoskeletal: Negative for joint swelling.  Skin: Negative for rash.  Neurological: Positive for headaches.  Hematological: Bruises/bleeds easily.  Psychiatric/Behavioral: Negative for dysphoric mood. The patient is not nervous/anxious.        Objective:   Physical Exam Well-developed male in no acute distress Nose without purulence or discharge noted Chest with mild decrease in breath sounds, but no wheezes or crackles Cardiac exam with regular rate and rhythm Lower extremities without edema, no cyanosis Alert and oriented, moves all 4 extremities.       Assessment & Plan:

## 2012-10-06 ENCOUNTER — Encounter: Payer: Self-pay | Admitting: Pulmonary Disease

## 2012-10-06 ENCOUNTER — Ambulatory Visit (INDEPENDENT_AMBULATORY_CARE_PROVIDER_SITE_OTHER): Payer: Medicare PPO | Admitting: Pulmonary Disease

## 2012-10-06 VITALS — BP 120/82 | HR 60 | Temp 97.4°F | Ht 72.0 in | Wt 204.2 lb

## 2012-10-06 DIAGNOSIS — J438 Other emphysema: Secondary | ICD-10-CM

## 2012-10-06 MED ORDER — PREDNISONE 10 MG PO TABS: ORAL_TABLET | ORAL | Status: DC

## 2012-10-06 MED ORDER — CEFDINIR 300 MG PO CAPS: 600.0000 mg | ORAL_CAPSULE | ORAL | Status: DC

## 2012-10-06 NOTE — Assessment & Plan Note (Signed)
The pt is having sinus pressure, frontal h/a, and nasal congestion with excessive mucus.  Will treat for a sinus infection and see how he does.

## 2012-10-06 NOTE — Progress Notes (Signed)
  Subjective:    Patient ID: Robert Turner, male    DOB: 01/24/1946, 67 y.o.   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  He also has acute symptoms of sinus pressure, nasal congestion with nonpurulent drainage, and also significant postnasal drip with cough.  He also has severe frontal headache as well.  He feels that his breathing has gotten a little bit worse since he has had these symptoms, with chest tightness noted at times.  He also had some chest congestion.  He denies any fever.   Review of Systems  Constitutional: Negative for fever and unexpected weight change.  HENT: Positive for rhinorrhea and postnasal drip. Negative for ear pain, nosebleeds, congestion, sore throat, sneezing, trouble swallowing, dental problem and sinus pressure.   Eyes: Negative for redness and itching.  Respiratory: Positive for cough, choking, shortness of breath and wheezing. Negative for chest tightness.   Cardiovascular: Negative for palpitations and leg swelling.  Gastrointestinal: Negative for nausea and vomiting.  Genitourinary: Negative for dysuria.  Musculoskeletal: Negative for joint swelling.  Skin: Negative for rash.  Neurological: Positive for headaches.  Hematological: Does not bruise/bleed easily.  Psychiatric/Behavioral: Negative for dysphoric mood. The patient is not nervous/anxious.        Objective:   Physical Exam Well-developed male in no acute distress Nose with purulent discharge noted, but erythematous and edematous mucosa noted bilaterally Oropharynx without exudates Neck without lymphadenopathy or thyromegaly Chest with decreased breath sounds, but no wheezes or rhonchi Cardiac exam with regular rate and rhythm Lower extremities without edema, cyanosis Alert and oriented, moves all 4 extremities.        Assessment & Plan:

## 2012-10-06 NOTE — Assessment & Plan Note (Signed)
The patient is having some increased shortness of breath and chest tightness, but I do not think he is in a full blown COPD exacerbation.  We will treat him with a course of prednisone for his nasal inflammation, and this will help his breathing as well.  I have stressed to him the importance of taking symbicort twice a day instead of once a day.

## 2012-10-06 NOTE — Addendum Note (Signed)
Addended by: Caryl Ada on: 10/06/2012 09:28 AM   Modules accepted: Orders

## 2012-10-06 NOTE — Patient Instructions (Addendum)
Will treat you for a sinus infection with omnicef 300mg , 2 each am for 7 days. Will give you a short course of prednisone to help with airway inflammation Take symbicort TWICE a day. Stay as active as possible.  followup with me in 6mos.  Will send in refills for your symbicort.

## 2012-12-02 ENCOUNTER — Other Ambulatory Visit: Payer: Self-pay | Admitting: Pulmonary Disease

## 2013-04-10 ENCOUNTER — Ambulatory Visit (INDEPENDENT_AMBULATORY_CARE_PROVIDER_SITE_OTHER): Payer: Medicare PPO | Admitting: Pulmonary Disease

## 2013-04-10 ENCOUNTER — Encounter: Payer: Self-pay | Admitting: Pulmonary Disease

## 2013-04-10 VITALS — BP 140/82 | HR 71 | Temp 97.7°F | Ht 72.0 in | Wt 203.2 lb

## 2013-04-10 DIAGNOSIS — J329 Chronic sinusitis, unspecified: Secondary | ICD-10-CM

## 2013-04-10 DIAGNOSIS — J439 Emphysema, unspecified: Secondary | ICD-10-CM

## 2013-04-10 DIAGNOSIS — J438 Other emphysema: Secondary | ICD-10-CM

## 2013-04-10 NOTE — Assessment & Plan Note (Signed)
The patient has been staying compliant horn symbicort, and is not having any chest congestion or increased shortness of breath.  I've asked him to continue on his symbicort, and try to stay as active as possible.

## 2013-04-10 NOTE — Assessment & Plan Note (Signed)
The patient is complaining of cough with white mucus production, but a lot of his symptoms appear to be coming from his sinuses.  It is unclear if this is chronic rhinitis versus sinusitis, but did not resolve with a course of antibiotics.  Will therefore work on nasal hygiene, and we'll also check a scan of his sinuses for completeness since he did not improve with antibiotics.

## 2013-04-10 NOTE — Patient Instructions (Addendum)
Try mucinex dm extra strength, take one in am and pm with large glass of water everyday for next 2 weeks Try nasonex 2 sprays each nostril each am for next 2 weeks to help allergies and sinus issues. No change in breathing medications Will schedule for xray of your sinuses to make sure this is not a chronic sinus infection that is not resolving. followup with me in 6mos if doing well.  Will call you with results of your sinus xray.

## 2013-04-10 NOTE — Progress Notes (Signed)
  Subjective:    Patient ID: Robert Turner, male    DOB: Jul 14, 1946, 67 y.o.   MRN: [id number]  HPI Patient comes in today for followup of his known COPD.  He is staying compliant on his symbicort, and has not seen a big change in his breathing over the last 6 months.  He does complain of a persistent cough with white mucus, but denies chest congestion.  He is having sinus pressure and frontal headaches, and was treated for acute sinusitis 6 months ago that he feels never totally went away.  He is having rhinorrhea as well as postnasal drip.   Review of Systems  Constitutional: Negative for fever and unexpected weight change.  HENT: Positive for congestion, rhinorrhea, postnasal drip and sinus pressure. Negative for ear pain, nosebleeds, sore throat, sneezing, trouble swallowing and dental problem.   Eyes: Negative for redness and itching.  Respiratory: Positive for cough and shortness of breath. Negative for chest tightness and wheezing.   Cardiovascular: Negative for palpitations and leg swelling.  Gastrointestinal: Negative for nausea and vomiting.  Genitourinary: Negative for dysuria.  Musculoskeletal: Negative for joint swelling.  Skin: Negative for rash.  Neurological: Negative for headaches.  Hematological: Does not bruise/bleed easily.  Psychiatric/Behavioral: Negative for dysphoric mood. The patient is not nervous/anxious.        Objective:   Physical Exam Well-developed male in no acute distress Nose without purulence or discharge noted, definite erythematous mucosa. Oropharynx clear Neck without lymphadenopathy or thyromegaly Chest with mildly decreased breath sounds, no wheezes or rhonchi. Cardiac exam with regular rate and rhythm Lower extremities without edema, cyanosis Alert and oriented, moves all 4 extremities.       Assessment & Plan:

## 2013-04-11 ENCOUNTER — Ambulatory Visit (INDEPENDENT_AMBULATORY_CARE_PROVIDER_SITE_OTHER)
Admission: RE | Admit: 2013-04-11 | Discharge: 2013-04-11 | Disposition: A | Discharge status: Home / Self Care | Payer: Medicare PPO | Source: Ambulatory Visit | Attending: Pulmonary Disease | Admitting: Pulmonary Disease

## 2013-04-11 DIAGNOSIS — J329 Chronic sinusitis, unspecified: Secondary | ICD-10-CM

## 2013-10-04 ENCOUNTER — Other Ambulatory Visit: Payer: Self-pay | Admitting: Pulmonary Disease

## 2013-10-10 ENCOUNTER — Ambulatory Visit (INDEPENDENT_AMBULATORY_CARE_PROVIDER_SITE_OTHER): Payer: Medicare PPO | Admitting: Pulmonary Disease

## 2013-10-10 ENCOUNTER — Encounter: Payer: Self-pay | Admitting: Pulmonary Disease

## 2013-10-10 VITALS — BP 112/68 | HR 60 | Temp 97.6°F | Ht 72.0 in | Wt 200.4 lb

## 2013-10-10 DIAGNOSIS — J438 Other emphysema: Secondary | ICD-10-CM

## 2013-10-10 DIAGNOSIS — J439 Emphysema, unspecified: Secondary | ICD-10-CM

## 2013-10-10 NOTE — Progress Notes (Signed)
   Subjective:    Patient ID: Robert Turner, male    DOB: 1945/10/09, 68 y.o.   MRN: [id number]  HPI The patient comes in today for followup of his known COPD. His breathing has been near his usual baseline, but he thinks overall it may be a little worse. He has not had any acute exacerbation, chest congestion, or purulent mucus. He has been trying to stay active.   Review of Systems  Constitutional: Negative for fever and unexpected weight change.  HENT: Negative for congestion, dental problem, ear pain, nosebleeds, postnasal drip, rhinorrhea, sinus pressure, sneezing, sore throat and trouble swallowing.   Eyes: Negative for redness and itching.  Respiratory: Positive for cough, chest tightness and shortness of breath. Negative for wheezing.        Chest congestion--white mucus  Cardiovascular: Positive for chest pain ( scheduled to see Cardiologist). Negative for palpitations and leg swelling.       Shoulder and arm pain--radiates from chest  Gastrointestinal: Negative for nausea and vomiting.  Genitourinary: Negative for dysuria.  Musculoskeletal: Negative for joint swelling.  Skin: Negative for rash.  Neurological: Negative for headaches.  Hematological: Does not bruise/bleed easily.  Psychiatric/Behavioral: Negative for dysphoric mood. The patient is not nervous/anxious.        Objective:   Physical Exam Well-developed male in no acute distress Nose without purulence or discharge noted Neck without lymphadenopathy or thyromegaly Chest with mildly decreased breath sounds, no wheezing Cardiac exam with regular rate and rhythm Lower extremities without significant edema, no cyanosis Alert and oriented, moves all 4 extremities.       Assessment & Plan:

## 2013-10-10 NOTE — Patient Instructions (Signed)
Stay on symbicort, and will send in new prescription with refills. Will try you on Incruse, one inhalation each am everyday to see if your breathing improves. Please call me in 4 weeks to let me know how things are going with the addition of a new inhaler. followup with me in 6mos.

## 2013-10-10 NOTE — Assessment & Plan Note (Signed)
The patient has fairly severe COPD, and continues to have dyspnea on exertion that he thinks may be a little worse. He has not: And anti-cholinergic currently, and I would like to try to add this to his regimen to see if things improve. He is intolerant of Spiriva, and therefore will try on incruse.  I also stressed to him the importance of staying active and work on conditioning.

## 2014-04-12 ENCOUNTER — Ambulatory Visit (INDEPENDENT_AMBULATORY_CARE_PROVIDER_SITE_OTHER): Payer: Medicare PPO | Admitting: Pulmonary Disease

## 2014-04-12 ENCOUNTER — Encounter: Payer: Self-pay | Admitting: Pulmonary Disease

## 2014-04-12 VITALS — BP 110/60 | HR 60 | Temp 98.0°F | Ht 72.0 in | Wt 200.2 lb

## 2014-04-12 DIAGNOSIS — Z23 Encounter for immunization: Secondary | ICD-10-CM

## 2014-04-12 DIAGNOSIS — J439 Emphysema, unspecified: Secondary | ICD-10-CM

## 2014-04-12 DIAGNOSIS — J438 Other emphysema: Secondary | ICD-10-CM

## 2014-04-12 MED ORDER — ALBUTEROL SULFATE (2.5 MG/3ML) 0.083% IN NEBU: 2.5000 mg | INHALATION_SOLUTION | Freq: Four times a day (QID) | RESPIRATORY_TRACT | Status: AC | PRN

## 2014-04-12 NOTE — Assessment & Plan Note (Signed)
The patient feels that he is at his usual baseline on Symbicort, but is having some congestion and cough in the mornings when he first arises. I will give him a new medication for his nebulizer machine, and I've asked him to try one albuterol treatment in the mornings to start his day. He can then take his Symbicort an hour or so later. I've also encouraged him to stay as active as possible.

## 2014-04-12 NOTE — Progress Notes (Signed)
   Subjective:    Patient ID: Robert Turner, male    DOB: [birth year], 68 y.o.   MRN: [id number]  HPI The patient comes in today for followup of his known COPD. He has been tried on 2 types of anti-cholinergic inhaled therapy, and was intolerant of both. He continues on Symbicort, and feels that his breathing is near his usual baseline. His only complaint today is that he sometimes awakens in the morning with chest congestion and cough with white mucus that needs to be cleared before he gets his day started. He has a nebulizer machine, but does not have any liquid albuterol currently that is within date.  Overall, he feels that he is near his usual baseline.   Review of Systems  Constitutional: Negative for fever and unexpected weight change.  HENT: Positive for congestion, postnasal drip and rhinorrhea. Negative for dental problem, ear pain, nosebleeds, sinus pressure, sneezing, sore throat and trouble swallowing.   Eyes: Negative for redness and itching.  Respiratory: Positive for cough, shortness of breath and wheezing. Negative for chest tightness.   Cardiovascular: Negative for palpitations and leg swelling.  Gastrointestinal: Negative for nausea and vomiting.  Genitourinary: Negative for dysuria.  Musculoskeletal: Negative for joint swelling.  Skin: Negative for rash.  Neurological: Negative for headaches.  Hematological: Does not bruise/bleed easily.  Psychiatric/Behavioral: Negative for dysphoric mood. The patient is not nervous/anxious.        Objective:   Physical Exam Well-developed male in no acute distress Nose without purulence or discharge noted Neck without lymphadenopathy or thyromegaly Chest with decreased breath sounds, but no crackles or wheezes Cardiac exam with regular rate and rhythm Lower extremities without edema, no cyanosis Alert and oriented, moves all 4 extremities.       Assessment & Plan:

## 2014-04-12 NOTE — Patient Instructions (Signed)
Will give you the flu shot today. Will order ONE BOX of albuterol with 6 fills, to take one treatment each am to get his day started Stay on symbicort. Stay as active as possible. followup with me again in 6mos.

## 2014-04-12 NOTE — Addendum Note (Signed)
Addended by: Tommie Sams on: 04/12/2014 12:00 PM   Modules accepted: Orders

## 2014-10-06 ENCOUNTER — Other Ambulatory Visit: Payer: Self-pay | Admitting: Pulmonary Disease

## 2014-10-11 ENCOUNTER — Encounter: Payer: Self-pay | Admitting: Pulmonary Disease

## 2014-10-11 ENCOUNTER — Ambulatory Visit (INDEPENDENT_AMBULATORY_CARE_PROVIDER_SITE_OTHER): Payer: Medicare HMO | Admitting: Pulmonary Disease

## 2014-10-11 VITALS — BP 122/66 | HR 60 | Temp 97.8°F | Ht 72.0 in | Wt 205.2 lb

## 2014-10-11 DIAGNOSIS — J438 Other emphysema: Secondary | ICD-10-CM

## 2014-10-11 NOTE — Progress Notes (Signed)
   Subjective:    Patient ID: Robert Turner, male    DOB: 11-Jun-1946, 69 y.o.   MRN: [id number]  HPI The patient comes in today for follow-up of his known COPD. He has been staying on his bronchodilator regimen, and has done well until about 2 weeks ago when he had an episode of acute bronchitis. He was treated with antibiotics and had a good response. He currently feels that he is back to his usual baseline.   Review of Systems  Constitutional: Negative for fever and unexpected weight change.  HENT: Positive for congestion and postnasal drip. Negative for dental problem, ear pain, nosebleeds, rhinorrhea, sinus pressure, sneezing, sore throat and trouble swallowing.   Eyes: Negative for redness and itching.  Respiratory: Positive for cough, chest tightness, shortness of breath and wheezing.   Cardiovascular: Negative for palpitations and leg swelling.  Gastrointestinal: Negative for nausea and vomiting.  Genitourinary: Negative for dysuria.  Musculoskeletal: Negative for joint swelling.  Skin: Negative for rash.  Neurological: Negative for headaches.  Hematological: Does not bruise/bleed easily.  Psychiatric/Behavioral: Negative for dysphoric mood. The patient is not nervous/anxious.        Objective:   Physical Exam Well-developed male in no acute distress Nose without purulence or discharge noted Neck without lymphadenopathy or thyromegaly Chest with decreased breath sounds, no active wheezing Cardiac exam with regular rate and rhythm Lower extremities with no significant edema, no cyanosis Alert and oriented, moves all 4 extremities.       Assessment & Plan:

## 2014-10-11 NOTE — Assessment & Plan Note (Signed)
The patient overall has been stable since his last visit, but did have an episode of acute bronchitis a few weeks ago that responded well to antibiotics. I have asked him to stay on his current bronchodilator regimen, and stressed to him again the importance of some type of conditioning program in order to maintain quality of life.

## 2014-10-11 NOTE — Patient Instructions (Signed)
No change in current breathing medications Try and stay as active as possible. followup with me again in 6mos.

## 2015-04-09 ENCOUNTER — Ambulatory Visit (INDEPENDENT_AMBULATORY_CARE_PROVIDER_SITE_OTHER): Payer: Medicare HMO | Admitting: Internal Medicine

## 2015-04-09 ENCOUNTER — Encounter: Payer: Self-pay | Admitting: Internal Medicine

## 2015-04-09 ENCOUNTER — Ambulatory Visit (INDEPENDENT_AMBULATORY_CARE_PROVIDER_SITE_OTHER)
Admission: RE | Admit: 2015-04-09 | Discharge: 2015-04-09 | Disposition: A | Discharge status: Home / Self Care | Payer: Medicare HMO | Source: Ambulatory Visit | Attending: Internal Medicine | Admitting: Internal Medicine

## 2015-04-09 DIAGNOSIS — J449 Chronic obstructive pulmonary disease, unspecified: Secondary | ICD-10-CM | POA: Diagnosis not present

## 2015-04-09 MED ORDER — ALBUTEROL SULFATE HFA 108 (90 BASE) MCG/ACT IN AERS: 2.0000 | INHALATION_SPRAY | RESPIRATORY_TRACT | Status: DC | PRN

## 2015-04-09 MED ORDER — BUDESONIDE-FORMOTEROL FUMARATE 160-4.5 MCG/ACT IN AERO: INHALATION_SPRAY | RESPIRATORY_TRACT | Status: AC

## 2015-04-09 MED ORDER — PREDNISONE 10 MG PO TABS: ORAL_TABLET | ORAL | Status: DC

## 2015-04-09 MED ORDER — ALBUTEROL SULFATE HFA 108 (90 BASE) MCG/ACT IN AERS: 2.0000 | INHALATION_SPRAY | RESPIRATORY_TRACT | Status: AC | PRN

## 2015-04-09 NOTE — Patient Instructions (Signed)
Please remember to go to the x-ray department downstairs for your tests - we will call you with the results when they are available.  Work on Musician technique:  relax and gently blow all the way out then take a nice smooth deep breath back in, triggering the inhaler at same time you start breathing in.  Hold for up to 5 seconds if you can. Blow out thru nose. Rinse and gargle with water when done      Plan A = Automatic > symbicort 160 Take 2 puffs first thing in am and then another 2 puffs about 12 hours later.   Plan B = Backup - Only use your albuterol as a rescue medication to be used if you can't catch your breath by resting or doing a relaxed purse lip breathing pattern.  - The less you use it, the better it will work when you need it. - Ok to use up to 2 puffs  every 4 hours if you must but call for immediate appointment if use goes up over your usual need - Don't leave home without it !!  (think of it like the spare tire for your car)   Plan C = nebulizer - only use it if you try the inhaler first and it doesn't work and call us immediately if you note need go up    Please schedule a follow up visit in 3 months but call sooner if needed with pfts on return

## 2015-04-09 NOTE — Progress Notes (Signed)
Patient ID: Robert Turner, male   DOB: 1945-12-13,    MRN: [id number]    Brief patient profile:  72 yowm former smoker quit 2007 with GOLD II copd in 2013 with reverisibility prev followed Dr Shelle Iron.      History of Present Illness  04/09/2015 1st [facility] office visit/ Jillianne Gamino re: copd / extended ov  Chief Complaint  Patient presents with  . Follow-up    Former patient of Dr Shelle Iron. Pt c/o increased DOE over the past few wks. He rarely uses albuterol inhaler and uses neb 2-3 x per wk.   cough mostly white/ am congestion. Baseline doe = MMRC1 = can walk nl pace, flat grade, can't hurry or go uphills or steps s sob    No obvious day to day or daytime variability or assoc cp or chest tightness, subjective wheeze or overt sinus or hb symptoms. No unusual exp hx or h/o childhood pna/ asthma or knowledge of premature birth.  Sleeping ok without nocturnal  or early am exacerbation  of respiratory  c/o's or need for noct saba. Also denies any obvious fluctuation of symptoms with weather or environmental changes or other aggravating or alleviating factors except as outlined above   Current Medications, Allergies, Complete Past Medical History, Past Surgical History, Family History, and Social History were reviewed in [employer] record.  ROS  The following are not active complaints unless bolded sore throat, dysphagia, dental problems, itching, sneezing,  nasal congestion or excess/ purulent secretions, ear ache,   fever, chills, sweats, unintended wt loss, classically pleuritic or exertional cp, hemoptysis,  orthopnea pnd or leg swelling, presyncope, palpitations, abdominal pain, anorexia, nausea, vomiting, diarrhea  or change in bowel or bladder habits, change in stools or urine, dysuria,hematuria,  rash, arthralgias, visual complaints, headache, numbness, weakness or ataxia or problems with walking or coordination,  change in mood/affect or memory.      Objective:   Physical Exam    amb nad    Wt Readings from Last 3 Encounters:  04/09/15 199 lb (90.266 kg)  10/11/14 205 lb 3.2 oz (93.078 kg)  04/12/14 200 lb 3.2 oz (90.81 kg)    Vital signs reviewed        HEENT: very poor dentition,  Nl  turbinates, and orophanx. Nl external ear canals without cough reflex   NECK :  without JVD/Nodes/TM/ nl carotid upstrokes bilaterally   LUNGS: no acc muscle use, distant bs/ hyperresonant to percussion / mod prolonged Texp   CV:  RRR  no s3 or murmur or increase in P2, no edema   ABD:  soft and nontender with pos late insp hoover's in the supine position. No bruits or organomegaly, bowel sounds nl  MS:  warm without deformities, calf tenderness, cyanosis or clubbing  SKIN: warm and dry without lesions    NEURO:  alert, approp, no deficits     I personally reviewed images and agree with radiology impression as follows:  CXR:  04/09/2015  Emphysema without acute disease.   Assessment:

## 2015-04-10 ENCOUNTER — Telehealth: Payer: Self-pay | Admitting: Internal Medicine

## 2015-04-10 ENCOUNTER — Encounter: Payer: Self-pay | Admitting: Internal Medicine

## 2015-04-10 NOTE — Assessment & Plan Note (Addendum)
PFT'S 2013:  FEV1 2.02 (62%), ratio 46, good response to BD, significant airtrapping, DLCO 93%. Intolerant of spiriva per pt. And Trial of incruse 10/2013 >> "didn't sit well" with him.   Symptoms more recently difficult to control ? Why?  DDX of  difficult airways management all start with A and  include Adherence, Ace Inhibitors, Acid Reflux, Active Sinus Disease, Alpha 1 Antitripsin deficiency, Anxiety masquerading as Airways dz,  ABPA,  allergy(esp in young), Aspiration (esp in elderly), Adverse effects of meds,  Active smokers, A bunch of PE's (a small clot burden can't cause this syndrome unless there is already severe underlying pulm or vascular dz with poor reserve) plus two Bs  = Bronchiectasis and Beta blocker use..and one C= CHF   Adherence is always the initial "prime suspect" and is a multilayered concern that requires a "trust but verify" approach in every patient - starting with knowing how to use medications, especially inhalers, correctly, keeping up with refills and understanding the fundamental difference between maintenance and prns vs those medications only taken for a very short course and then stopped and not refilled.  -The proper method of use, as well as anticipated side effects, of a metered-dose inhaler are discussed and demonstrated to the patient. Improved effectiveness after extensive coaching during this visit to a level of approximately  90% from a baseline of < 50%   ? Allergy/ asthma component > Prednisone 10 mg take  4 each am x 2 days,   2 each am x 2 days,  1 each am x 2 days and stop   I had an extended discussion with the patient reviewing all relevant studies completed to date and  lasting 20  minutes of a 40 minute visit    Each maintenance medication was reviewed in detail including most importantly the difference between maintenance and prns and under what circumstances the prns are to be triggered using an action plan format that is not reflected in the  computer generated alphabetically organized AVS.    Please see instructions for details which were reviewed in writing and the patient given a copy highlighting the part that I personally wrote and discussed at today's ov.

## 2015-04-10 NOTE — Telephone Encounter (Signed)
Notes Recorded by Nyoka Cowden, MD on 04/09/2015 at 3:28 PM Call pt: Reviewed cxr and no acute change so no change in recommendations made at [facility] and spoke with pts wife and she is aware of cxr results per MW>  Nothing further is needed.

## 2015-04-11 NOTE — Progress Notes (Signed)
Quick Note:  Called and spoke with pt's wife. She stated she spoke to someone yesterday about the results. I reviewed them again with her. Pt's wife voiced understanding and had no further questions. ______

## 2015-04-25 IMAGING — CT CT PARANASAL SINUSES LIMITED
1 of 3 series · 13 of 30 positions shown, 17 images · non-contrast
Comparison: CT 08/09/2006.

CLINICAL DATA: Chronic sinusitis evaluation.  Persistent symptoms.
Chronic productive cough and drainage.  Sinus congestion.  Not
responding to have inhalers and decongestant.

CT PARANASAL SINUS LIMITED WITHOUT CONTRAST
TECHNIQUE: Multidetector CT images of the paranasal sinuses were
obtained in a single plane without contrast. BB marker was placed
on the right.

[Series 5: ltd sinus 3.0 h60s · axial · 0.36mm/px · z∈[-73,+21]mm · 13 of 18 slices shown, 17 images]
[im 2/18  brain]
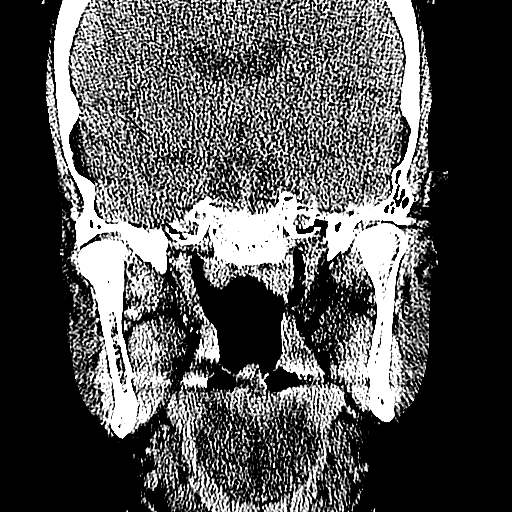
[im 2/18  bone]
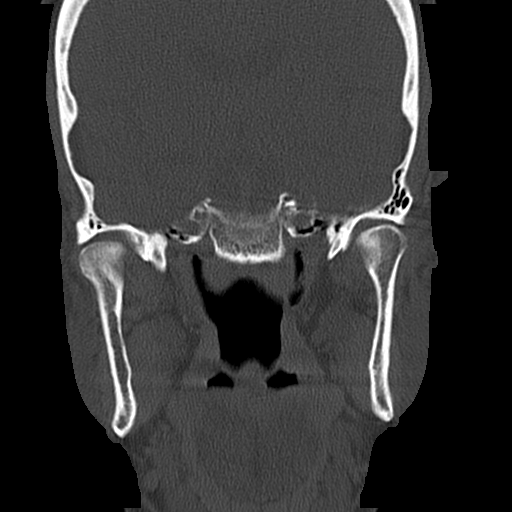
[im 3/18  bone]
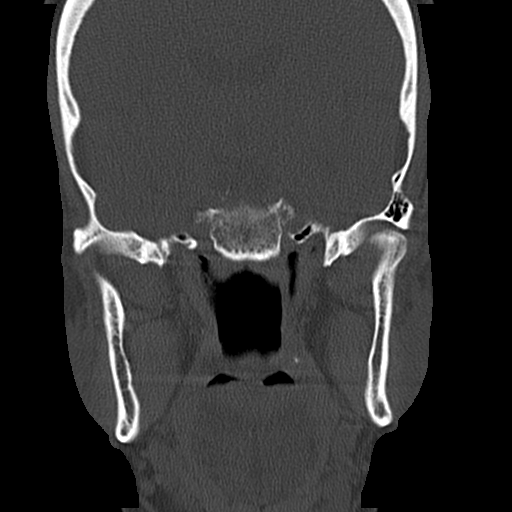
[im 4/18  bone]
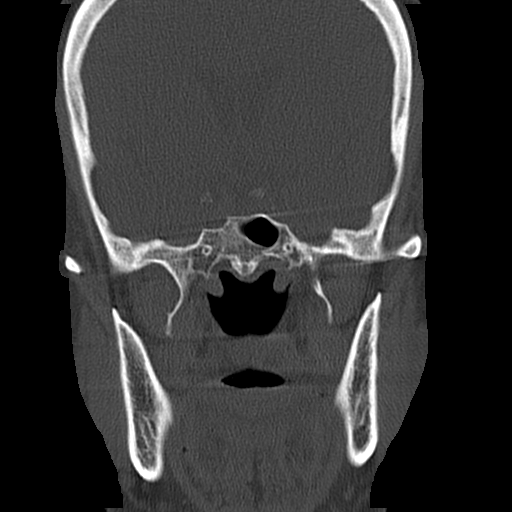
[im 5/18  bone]
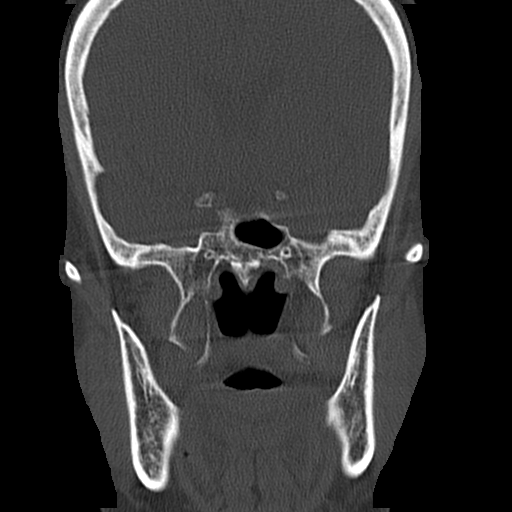
[im 7/18  brain]
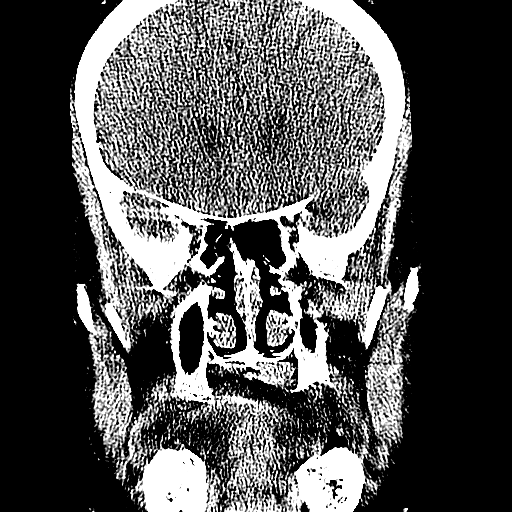
[im 7/18  bone]
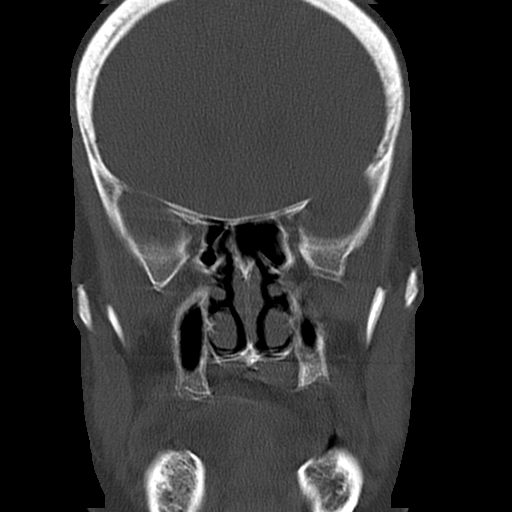
[im 8/18  bone]
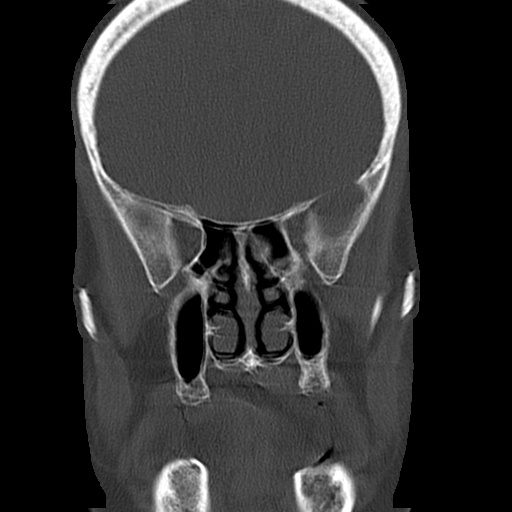
[im 9/18  bone]
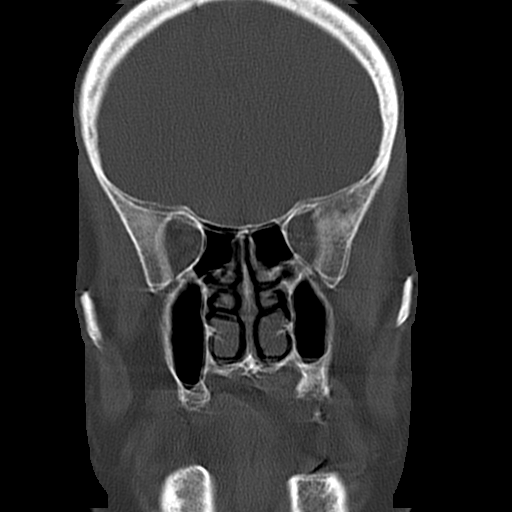
[im 10/18  bone]
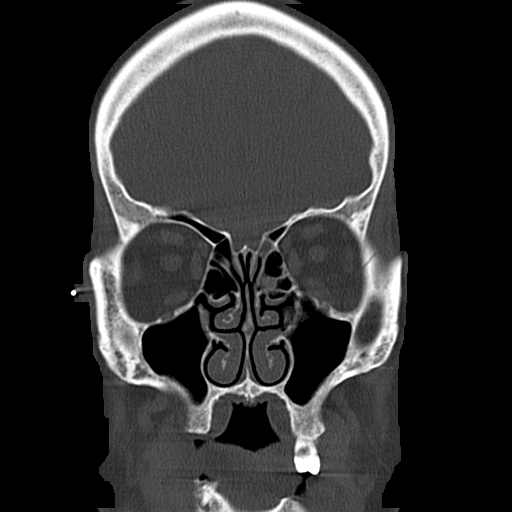
[im 11/18  brain]
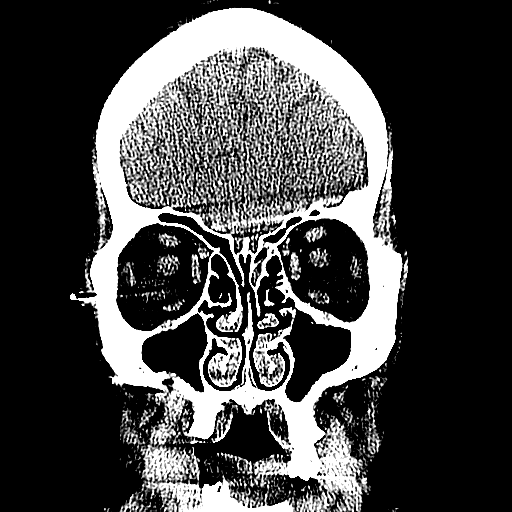
[im 11/18  bone]
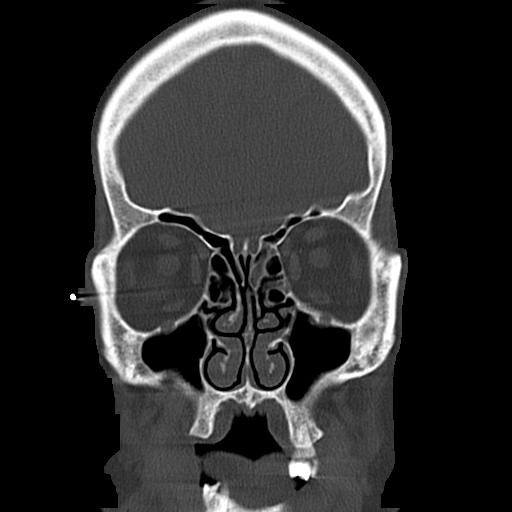
[im 13/18  bone]
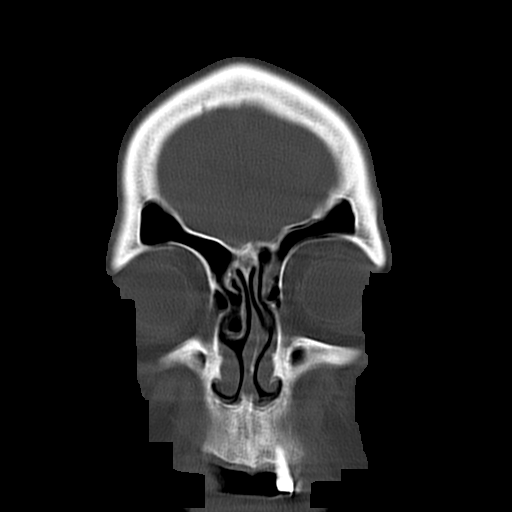
[im 14/18  bone]
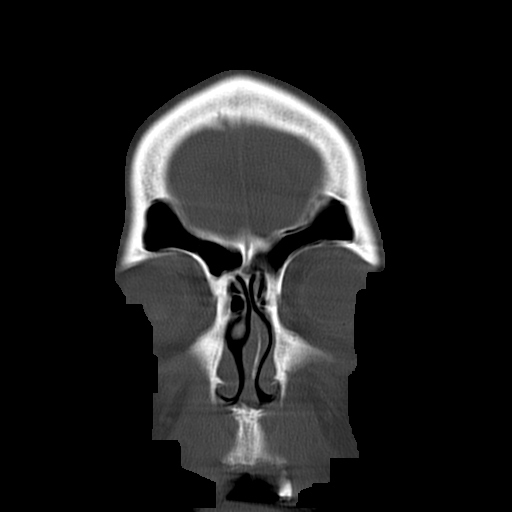
[im 15/18  bone]
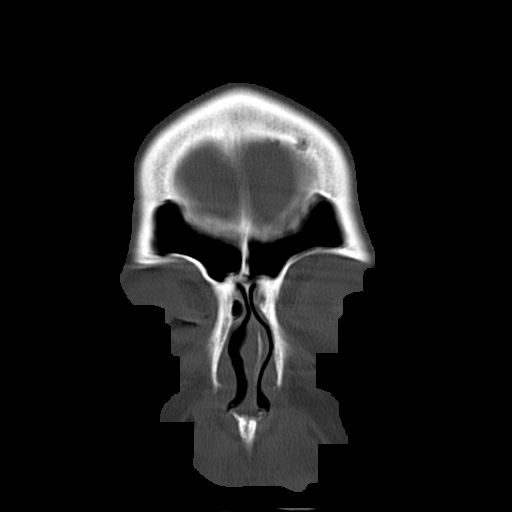
[im 16/18  brain]
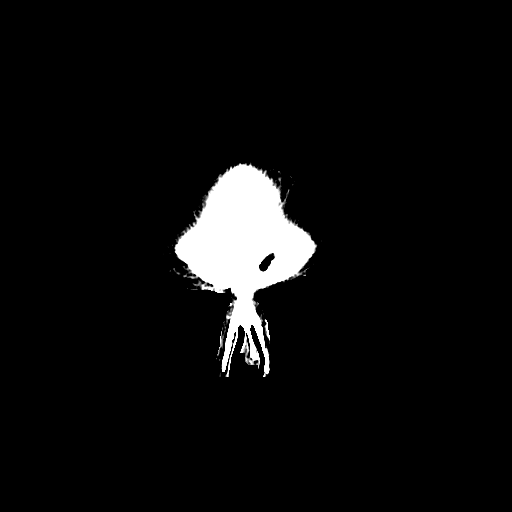
[im 16/18  bone]
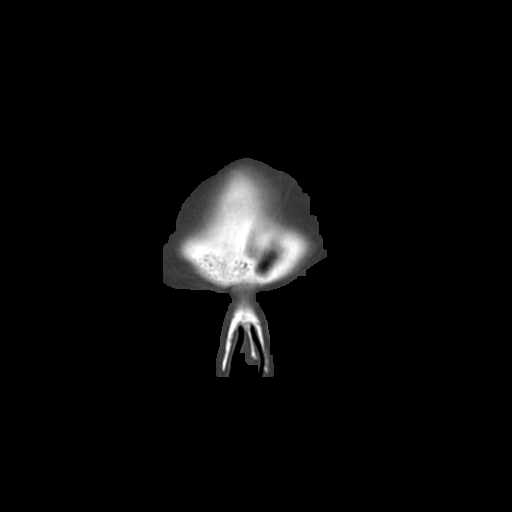

[13 of 30 positions shown; findings below may reference images not displayed]

FINDINGS: The nasal septum deviates slightly to the left.  The
nasal septum appears intact.  No perforation or destruction is
seen.  Concha bullosa on the right is noted and unchanged.

There is minimal mucosal thickening involving the inferolateral
aspect of the left maxillary sinus.  The degree of mucosal
thickening is diminished since the prior study.  There is minimal
mucosal thickening in a few ethmoid air cells.  No significant
mucosal thickening is seen involving the right maxillary sinus,
frontal sinuses, or sphenoid sinuses.  No air fluid levels are
seen.  No sinus expansion or bony destruction is seen.  Maxillary
ostia appear patent. Portions of the mastoids visualized appear
aerated.
IMPRESSION: There is minimal mucosal thickening involving the inferolateral
aspect of the left maxillary sinus.  The degree of mucosal
thickening is diminished since the prior study.  There is minimal
mucosal thickening in a few ethmoid air cells.  No significant
mucosal thickening is seen involving the right maxillary sinus,
frontal sinuses, or sphenoid sinuses.  No air fluid levels are
seen.

## 2015-07-09 ENCOUNTER — Ambulatory Visit (INDEPENDENT_AMBULATORY_CARE_PROVIDER_SITE_OTHER): Payer: Medicare HMO | Admitting: Internal Medicine

## 2015-07-09 ENCOUNTER — Encounter: Payer: Self-pay | Admitting: Internal Medicine

## 2015-07-09 VITALS — BP 130/64 | HR 66 | Ht 72.0 in | Wt 197.0 lb

## 2015-07-09 DIAGNOSIS — J310 Chronic rhinitis: Secondary | ICD-10-CM | POA: Diagnosis not present

## 2015-07-09 DIAGNOSIS — Z23 Encounter for immunization: Secondary | ICD-10-CM

## 2015-07-09 DIAGNOSIS — J449 Chronic obstructive pulmonary disease, unspecified: Secondary | ICD-10-CM | POA: Diagnosis not present

## 2015-07-09 LAB — PULMONARY FUNCTION TEST
DL/VA % pred: 76 %
DL/VA: 3.62 ml/min/mmHg/L
DLCO UNC % PRED: 70 %
DLCO UNC: 24.84 ml/min/mmHg
FEF 25-75 POST: 1.48 L/s
FEF 25-75 PRE: 0.91 L/s
FEF2575-%Change-Post: 62 %
FEF2575-%PRED-POST: 55 %
FEF2575-%PRED-PRE: 34 %
FEV1-%Change-Post: 18 %
FEV1-%PRED-POST: 73 %
FEV1-%Pred-Pre: 62 %
FEV1-POST: 2.59 L
FEV1-Pre: 2.19 L
FEV1FVC-%Change-Post: 5 %
FEV1FVC-%Pred-Pre: 70 %
FEV6-%CHANGE-POST: 11 %
FEV6-%PRED-POST: 98 %
FEV6-%PRED-PRE: 88 %
FEV6-POST: 4.44 L
FEV6-Pre: 3.99 L
FEV6FVC-%CHANGE-POST: 0 %
FEV6FVC-%PRED-PRE: 100 %
FEV6FVC-%Pred-Post: 100 %
FVC-%Change-Post: 11 %
FVC-%PRED-POST: 97 %
FVC-%Pred-Pre: 87 %
FVC-Post: 4.65 L
FVC-Pre: 4.16 L
PRE FEV1/FVC RATIO: 53 %
Post FEV1/FVC ratio: 56 %
Post FEV6/FVC ratio: 95 %
Pre FEV6/FVC Ratio: 96 %
RV % PRED: 159 %
RV: 4.1 L
TLC % pred: 118 %
TLC: 8.81 L

## 2015-07-09 NOTE — Assessment & Plan Note (Addendum)
PFT'S 2013:  FEV1 2.02 (62%), ratio 46, good response to BD, significant airtrapping, DLCO 93%. Intolerant of spiriva per pt. And Trial of incruse 10/2013 >> "didn't sit well" with him.  - PFT's  07/09/2015  FEV1 2.59 (73 % ) ratio 56  p 18 % improvement from saba with DLCO  70 % corrects to 76 % for alv volume     - 07/09/2015  extensive coaching HFA effectiveness =    75%  From a baseline of 50%    I had an extended final summary discussion with the patient reviewing all relevant studies completed to date and  lasting 15 to 20 minutes of a 25 minute visit on the following issues:      1)  reviewed the Fletcher curve with the patient that basically indicates  if you quit smoking when your best day FEV1 is still well preserved (as is very  clearly  the case here)   it is highly unlikely you will progress to severe disease (note: pfts are actually better than 3 y ago) and informed the patient there was no medication on the market that has proven to alter the curve/ its downward trajectory  or the likelihood of progression of their disease.  Therefore stopping smoking and maintaining abstinence is the most important aspect of his care, not choice of inhalers or for that matter, doctors.    2) treatment is entirely focused therefore on symtpom control which for now appears to be best treated with maint symb 160 2bid  3) pulmonary f/u in this setting can be prn  4) Each maintenance medication was reviewed in detail including most importantly the difference between maintenance and as needed and under what circumstances the prns are to be used.  Please see instructions for details which were reviewed in writing and the patient given a copy.

## 2015-07-09 NOTE — Progress Notes (Signed)
PFT done today. 

## 2015-07-09 NOTE — Progress Notes (Signed)
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      History of Present Illness  04/09/2015 1st [facility] office visit/ Shantika Bermea re: copd / extended ov  Chief Complaint  Patient presents with  . Follow-up    Former patient of Dr Shelle Ironlance. Pt c/o increased DOE over the past few wks. He rarely uses albuterol inhaler and uses neb 2-3 x per wk.   cough mostly white/ am congestion. Baseline doe = MMRC1 = can walk nl pace, flat grade, can't hurry or go uphills or steps s sob   rec Work on perfecting  inhaler technique:  Plan A = Automatic > symbicort 160 Take 2 puffs first thing in am and then another 2 puffs about 12 hours later.  Plan B = Backup - Only use your albuterol as a rescue medication Plan C = nebulizer - only use it if you try the inhaler first and it doesn't work and call us immediately if you note need go up    07/09/2015  f/u ov/Yaritza Leist re:   Copd GOLD II with reversibility - maint rx with symb 160 2bid  Chief Complaint  Patient presents with  . Follow-up    PFT done today. Pt reports his breathing has improved some. He has good days and bad but has not needed albuterol. He c/o sinus drainage, cough with white sputum and HA for the past several wks.      Not limited by breathing from desired activities  / not needing any saba at all    No obvious day to day or daytime variability or assoc cp or chest tightness, subjective wheeze or overt   hb symptoms. No unusual exp hx or h/o childhood pna/ asthma or knowledge of premature birth.  Sleeping ok without nocturnal  or early am exacerbation  of respiratory  c/o's or need for noct saba. Also denies any obvious fluctuation of symptoms with weather or environmental changes or other aggravating or alleviating factors except as outlined above   Current Medications, Allergies,  Complete Past Medical History, Past Surgical History, Family History, and Social History were reviewed in [facility] electronic medical record.  ROS  The following are not active complaints unless bolded sore throat, dysphagia, dental problems, itching, sneezing,  nasal congestion or excess/ purulent secretions, ear ache,   fever, chills, sweats, unintended wt loss, classically pleuritic or exertional cp, hemoptysis,  orthopnea pnd or leg swelling, presyncope, palpitations, abdominal pain, anorexia, nausea, vomiting, diarrhea  or change in bowel or bladder habits, change in stools or urine, dysuria,hematuria,  rash, arthralgias, visual complaints, headache, numbness, weakness or ataxia or problems with walking or coordination,  change in mood/affect or memory.      Objective:   Physical Exam    amb wm nad     07/09/2015         197   04/09/15 199 lb (90.266 kg)  10/11/14 205 lb 3.2 oz (93.078 kg)  04/12/14 200 lb 3.2 oz (90.81 kg)    Vital signs reviewed        HEENT: very poor dentition,  Nl  turbinates, and orophanx. Nl external ear canals without cough reflex   NECK :  without JVD/Nodes/TM/ nl carotid upstrokes bilaterally   LUNGS: no acc muscle use, distant bs/ hyperresonant to percussion / mod  prolonged Texp   CV:  RRR  no s3 or murmur or increase in P2, no edema   ABD:  soft and nontender with pos late insp hoover's in the supine position. No bruits or organomegaly, bowel sounds nl  MS:  warm without deformities, calf tenderness, cyanosis or clubbing  SKIN: warm and dry without lesions    NEURO:  alert, approp, no deficits     I personally reviewed images and agree with radiology impression as follows:  CXR:  04/09/2015  Emphysema without acute disease.   Assessment:

## 2015-07-09 NOTE — Patient Instructions (Signed)
Your breathing is better than it was before and you only have moderate copd but get a significant benefit from your symbicort when you take it correctly   So work on perfecting and maintaining otptimal  inhaler technique:  relax and gently blow all the way out then take a nice smooth deep breath back in, triggering the inhaler at same time you start breathing in.  Hold for up to 5 seconds if you can. Blow out thru nose. Rinse and gargle with water when done.   If you are satisfied with your treatment plan,  let your doctor know and he/she can either refill your medications or you can return here when your prescription runs out.     If in any way you are not 100% satisfied,  please tell us.  If 100% better, tell your friends!  Pulmonary follow up is as needed

## 2015-07-10 ENCOUNTER — Encounter: Payer: Self-pay | Admitting: Internal Medicine

## 2015-07-10 DIAGNOSIS — J310 Chronic rhinitis: Secondary | ICD-10-CM | POA: Insufficient documentation

## 2017-04-22 IMAGING — CR DG CHEST 2V
2 series · 2 of 2 positions shown · non-contrast
Comparison: PA and lateral chest 06/22/2014.  CT chest 12/23/2007.

CLINICAL DATA: Chronic cough.  History of COPD.

EXAM:
CHEST  2 VIEW

[view not recorded (1 of 2)]
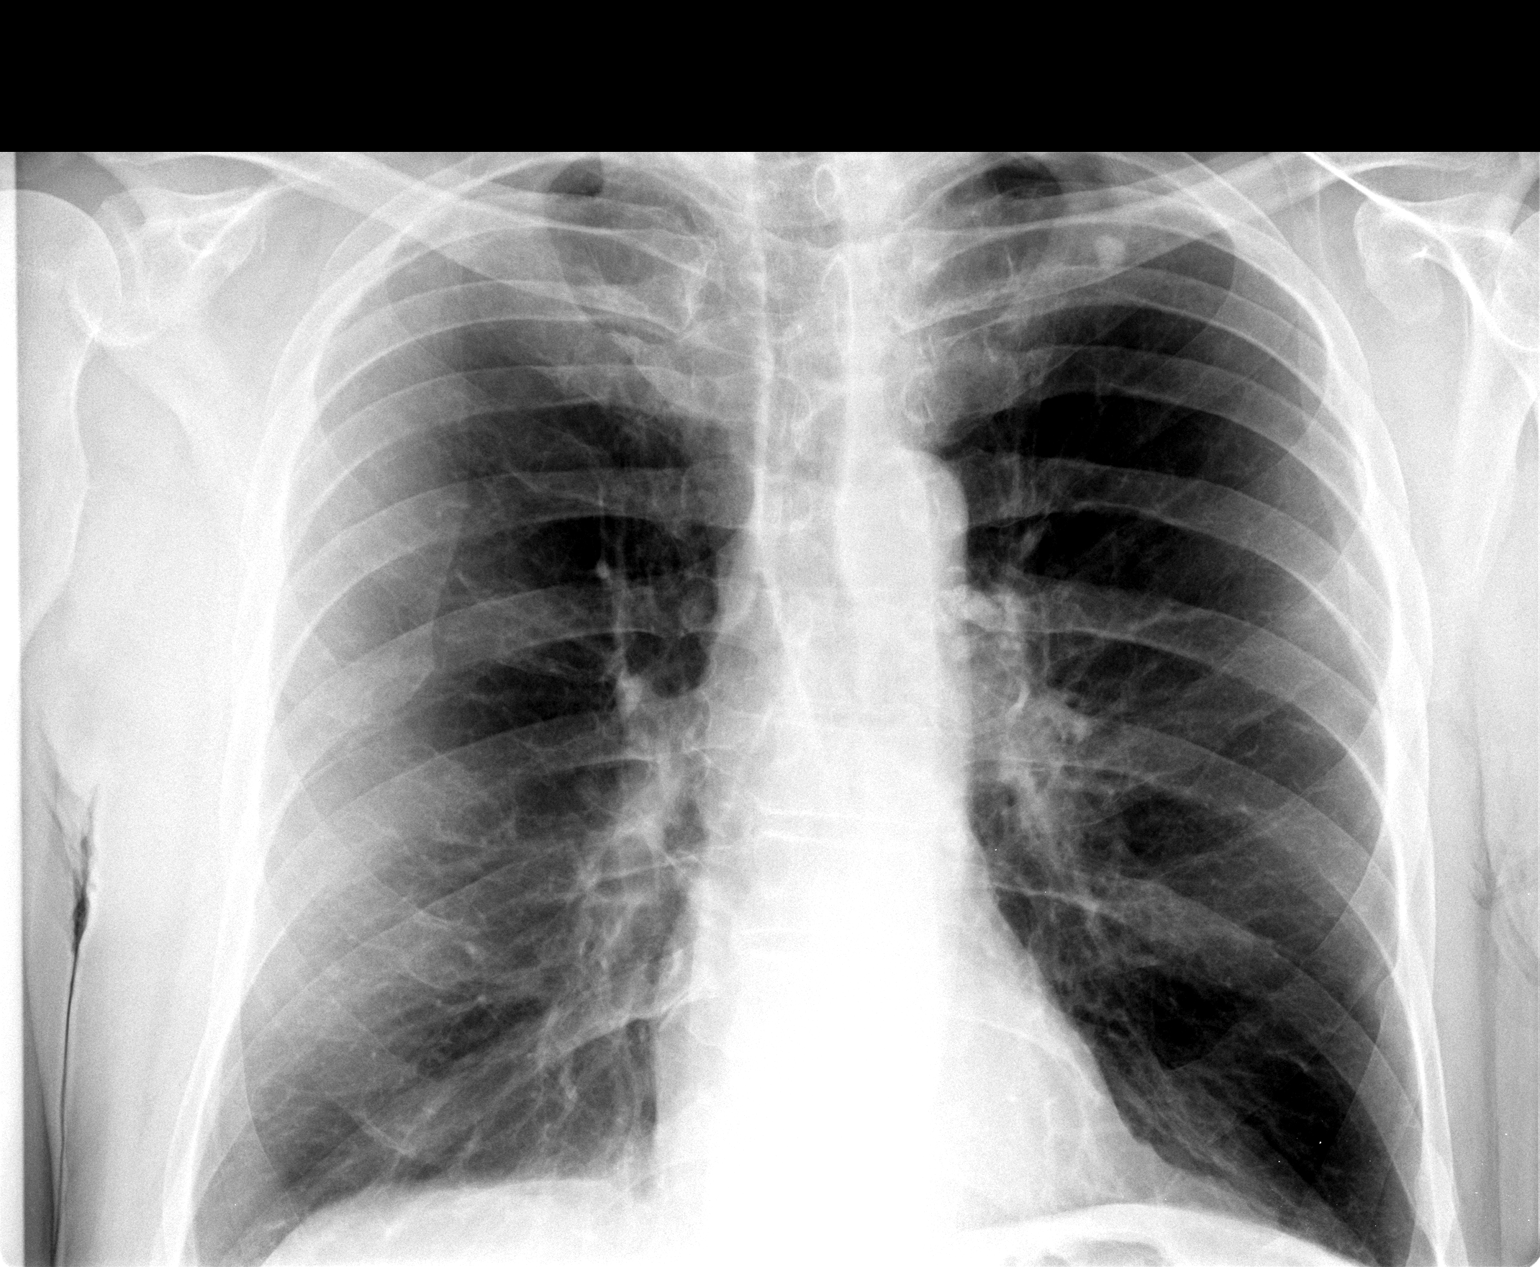

[view not recorded (2 of 2)]
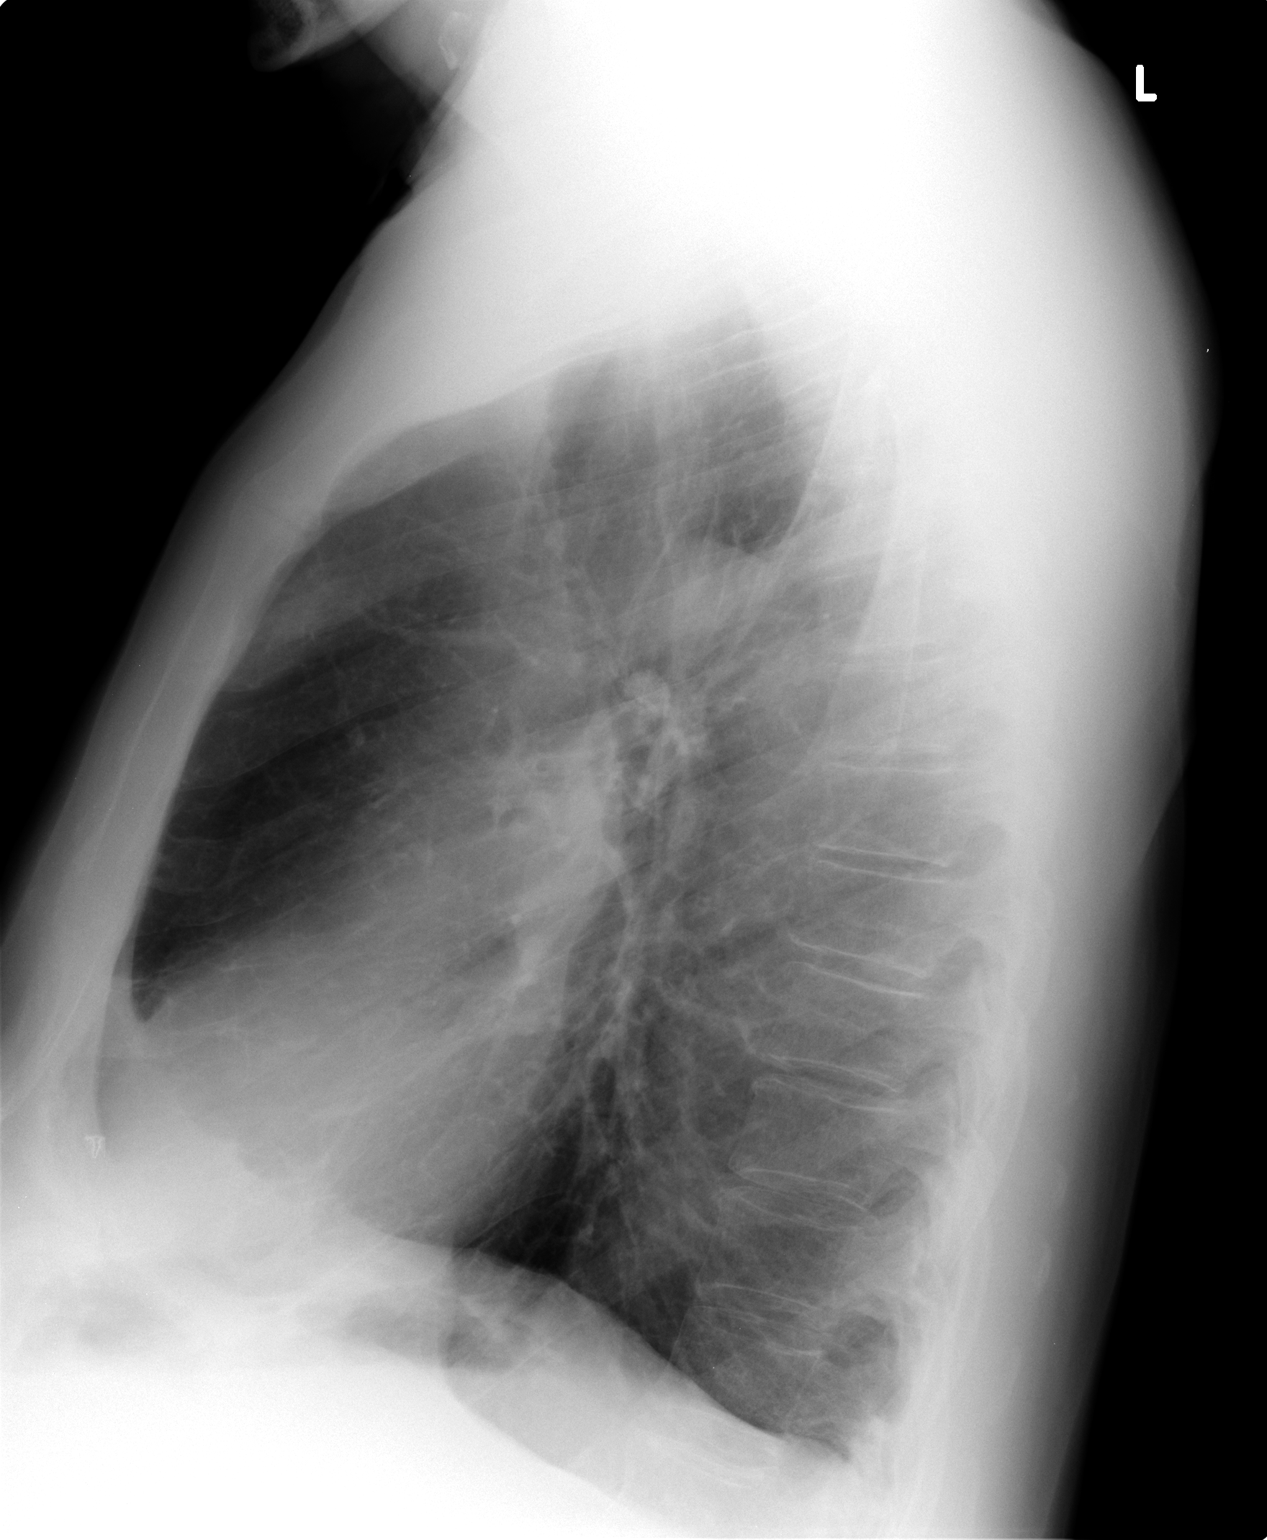

[2 of 2 positions shown; findings below may reference images not displayed]

FINDINGS: The chest is hyperexpanded with attenuation of the pulmonary
vasculature. Calcified granuloma in the left upper lobe is again
seen. The lungs are otherwise clear. Heart size is normal. No
pneumothorax or pleural effusion.
IMPRESSION: Emphysema without acute disease.

## 2019-09-04 DEATH — deceased
